# Patient Record
Sex: Female | Born: 1995 | Hispanic: No | Marital: Married | Smoking: Never smoker
Health system: Southern US, Community
[De-identification: ages and names within clinical notes are randomized; demographics above are authoritative.]

## PROBLEM LIST (undated history)

## (undated) DIAGNOSIS — Z8759 Personal history of other complications of pregnancy, childbirth and the puerperium: Secondary | ICD-10-CM

## (undated) DIAGNOSIS — Z789 Other specified health status: Secondary | ICD-10-CM

## (undated) DIAGNOSIS — K8020 Calculus of gallbladder without cholecystitis without obstruction: Secondary | ICD-10-CM

## (undated) DIAGNOSIS — O1490 Unspecified pre-eclampsia, unspecified trimester: Secondary | ICD-10-CM

## (undated) HISTORY — PX: NO PAST SURGERIES: SHX2092

## (undated) HISTORY — DX: Personal history of other complications of pregnancy, childbirth and the puerperium: Z87.59

---
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
## 2017-11-05 DIAGNOSIS — L709 Acne, unspecified: Secondary | ICD-10-CM | POA: Insufficient documentation

## 2020-08-18 ENCOUNTER — Other Ambulatory Visit: Payer: Self-pay | Admitting: Obstetrics and Gynecology

## 2020-08-18 ENCOUNTER — Other Ambulatory Visit (HOSPITAL_COMMUNITY): Payer: Self-pay | Admitting: Obstetrics and Gynecology

## 2020-08-18 DIAGNOSIS — O3680X1 Pregnancy with inconclusive fetal viability, fetus 1: Secondary | ICD-10-CM

## 2020-08-31 ENCOUNTER — Ambulatory Visit
Admission: RE | Admit: 2020-08-31 | Discharge: 2020-08-31 | Disposition: A | Discharge status: Home / Self Care | Payer: Self-pay | Source: Ambulatory Visit | Attending: Obstetrics and Gynecology | Admitting: Obstetrics and Gynecology

## 2020-08-31 ENCOUNTER — Other Ambulatory Visit: Payer: Self-pay

## 2020-08-31 DIAGNOSIS — O3680X1 Pregnancy with inconclusive fetal viability, fetus 1: Secondary | ICD-10-CM | POA: Insufficient documentation

## 2020-09-04 NOTE — L&D Delivery Note (Signed)
Delivery Note Patient received 3 doses of misoprostol overnight.  She then rapidly entered active labor and progressed to 10 cm without further augmentation.  She pushed well for just under 1 hour.  At 10:22 AM a viable female was delivered via Vaginal, Spontaneous (Presentation: Left Occiput Anterior).  APGAR: 9, 9; weight  2656 gm (5lb 13.7 oz).   Placenta status: Spontaneous, Intact.  Cord: 3 vessels with the following complications: None.  Cord pH: n/a  Anesthesia: Epidural Episiotomy: None Lacerations: Vaginal (left) Suture Repair: 2.0 vicryl rapide Est. Blood Loss (mL): 150  Mom to postpartum.  Baby to Couplet care / Skin to Skin.  Avaley Coop GEFFEL Annaleise Burger 03/18/2021, 11:28 AM

## 2020-09-07 LAB — OB RESULTS CONSOLE RPR: RPR: NONREACTIVE

## 2020-09-07 LAB — OB RESULTS CONSOLE ABO/RH: RH Type: POSITIVE

## 2020-09-07 LAB — OB RESULTS CONSOLE GC/CHLAMYDIA
Chlamydia: NEGATIVE
Gonorrhea: NEGATIVE

## 2020-09-07 LAB — OB RESULTS CONSOLE HIV ANTIBODY (ROUTINE TESTING): HIV: NONREACTIVE

## 2020-09-07 LAB — OB RESULTS CONSOLE RUBELLA ANTIBODY, IGM: Rubella: IMMUNE

## 2020-09-07 LAB — OB RESULTS CONSOLE HEPATITIS B SURFACE ANTIGEN: Hepatitis B Surface Ag: NEGATIVE

## 2020-09-23 ENCOUNTER — Other Ambulatory Visit: Payer: Self-pay

## 2020-10-04 ENCOUNTER — Other Ambulatory Visit: Payer: Self-pay

## 2020-10-08 ENCOUNTER — Other Ambulatory Visit: Payer: Self-pay | Admitting: Obstetrics and Gynecology

## 2020-10-08 DIAGNOSIS — Z1379 Encounter for other screening for genetic and chromosomal anomalies: Secondary | ICD-10-CM

## 2020-10-08 DIAGNOSIS — Z3A15 15 weeks gestation of pregnancy: Secondary | ICD-10-CM

## 2020-10-11 ENCOUNTER — Telehealth: Payer: Self-pay

## 2020-10-11 NOTE — Telephone Encounter (Signed)
Calculated good faith estimates for her appointments on 10/13/20 and 10/14/20. They have been printed out and will be given to her at check in.

## 2020-10-13 ENCOUNTER — Other Ambulatory Visit: Payer: Self-pay | Admitting: Obstetrics and Gynecology

## 2020-10-13 ENCOUNTER — Ambulatory Visit (HOSPITAL_BASED_OUTPATIENT_CLINIC_OR_DEPARTMENT_OTHER): Payer: BC Managed Care – PPO | Admitting: Genetic Counselor

## 2020-10-13 ENCOUNTER — Ambulatory Visit: Payer: Self-pay

## 2020-10-13 ENCOUNTER — Other Ambulatory Visit: Payer: Self-pay

## 2020-10-13 ENCOUNTER — Encounter: Payer: Self-pay | Admitting: *Deleted

## 2020-10-13 ENCOUNTER — Other Ambulatory Visit: Payer: Self-pay | Admitting: *Deleted

## 2020-10-13 ENCOUNTER — Ambulatory Visit (HOSPITAL_BASED_OUTPATIENT_CLINIC_OR_DEPARTMENT_OTHER): Payer: BC Managed Care – PPO

## 2020-10-13 ENCOUNTER — Ambulatory Visit: Payer: BC Managed Care – PPO | Attending: Obstetrics and Gynecology

## 2020-10-13 ENCOUNTER — Ambulatory Visit: Payer: Self-pay | Admitting: Genetic Counselor

## 2020-10-13 ENCOUNTER — Ambulatory Visit: Payer: BC Managed Care – PPO | Admitting: *Deleted

## 2020-10-13 DIAGNOSIS — O285 Abnormal chromosomal and genetic finding on antenatal screening of mother: Secondary | ICD-10-CM | POA: Insufficient documentation

## 2020-10-13 DIAGNOSIS — Z1379 Encounter for other screening for genetic and chromosomal anomalies: Secondary | ICD-10-CM

## 2020-10-13 DIAGNOSIS — O99212 Obesity complicating pregnancy, second trimester: Secondary | ICD-10-CM | POA: Insufficient documentation

## 2020-10-13 DIAGNOSIS — Z315 Encounter for genetic counseling: Secondary | ICD-10-CM

## 2020-10-13 DIAGNOSIS — Z3A14 14 weeks gestation of pregnancy: Secondary | ICD-10-CM | POA: Insufficient documentation

## 2020-10-13 DIAGNOSIS — Z3A15 15 weeks gestation of pregnancy: Secondary | ICD-10-CM

## 2020-10-13 NOTE — Progress Notes (Signed)
10/13/2020  Angela Lozano March 27, 1996 MRN: [id number] DOV: 10/13/2020  Angela Lozano presented to the [facility] 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 Angela Lozano was accompanied to her appointment by her husband, Angela Lozano.   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 female. Her current pregnancy has completed [redacted]w[redacted]d(Estimated Date of Delivery: 04/07/21).  Angela Lozano exposure to environmental toxins or chemical agents. She denied the use of alcohol, tobacco or street drugs. She reported taking prenatal vitamins and baby aspirin. She denied significant viral illnesses, fevers, and bleeding during the course of her pregnancy. Her medical and surgical histories were noncontributory.  Family History  A three generation pedigree was drafted and reviewed. Family history information was collected by [employer]. The family history is remarkable for the following:  - Ms. GKopischkehas a maternal aunt whose son has autism. Her husband's sister also has a daughter with autism. We reviewed that autism can be isolated, multifactorial, or part of a genetic syndrome; however, underlying genetic conditions account for less than 10% of autism diagnoses. Recurrence risk for first-degree relatives of individuals with idiopathic autism is estimated to be between 2-8%. If the relatives with autism in the family have isolated, idiopathic autism, the risk of recurrence in the couple's children would likely not be greatly elevated above the general population risk of ~1 in 550 or 1.5%. If there is a genetic explanation for autism in the family, recurrence risks could be higher. Without further information, risk  assessment is limited.  - Ms. GPetraliahusband had a distant cousin with Down syndrome. Down syndrome is most often caused by an entire extra copy of chromosome 21. This occurs due to errors in chromosomal division during the creation of egg and sperm cells in a process called nondisjunction. If the cousin in the family had full trisomy 232due to a nondisjunction event, there would not be an impact on Ms. GRealpersonal risk to have a pregnancy affected by Down syndrome, as this process occurs sporadically. However, every woman has an age-related risk for having a pregnancy affected by a chromosomal condition such as Down syndrome. Ms. GStephaninoninvasive prenatal screening performed during this pregnancy was low-risk for trisomy 21, significantly decreasing the chance of the current pregnancy being affected by Down syndrome. See Discussion section for more details.  The remaining family histories were reviewed and found to be noncontributory for birth defects, intellectual disability, recurrent pregnancy loss, and known genetic conditions.    The patient's ancestry is [place] The father of the pregnancy's ancestry is [place] Ashkenazi Jewish ancestry was denied. Distant consanguinity was reported, with the couple's great grandparents being an unknown degree of cousins to one another. Pedigree will be scanned under Media.  Discussion  NIPS result:   Ms. 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 The risks for trisomy 234 trisomy 114 trisomy 169 sex chromosome aneuploidies, and triploidy in the current pregnancy were significantly reduced.  We reviewed that 22q11.2 deletion syndrome, also called DiGeorge syndrome or velocardiofacial syndrome is caused by the deletion of genes on one end of chromosome 22 (the q11.2 region of the chromosome).  Common features of this condition include heart defects, cleft palate, characteristic facial  features, immune deficiency, and learning disabilities. Approximately 20% of individuals with 22q11.2 deletion syndrome are estimated to have autism spectrum disorders. Less commonly, individuals with 22q11.2 deletion syndrome may have an autoimmune disease, growth hormone deficiency, hearing loss, and/or psychiatric illness. Symptoms may vary significantly from person to person, even among relatives. Most individuals with 22q11.2 deletion syndrome live into adulthood.    Approximately 90% of individuals with 22q11.2 deletion syndrome have a de novo deletion of the 22q11.2 region that occurred for the first time in them. Approximately 10% of individuals inherit the deletion from one parent who also carries the deletion of the 22q11.2 region. Some individuals are not diagnosed with the condition until they have a child that is more severely affected than them. 22q11.2 deletion syndrome is an autosomal dominant condition, meaning once a person has the condition they have a 50% (1 in 2) chance to pass on the chromosome 22 that has the deletion with each pregnancy. However, parents and children with 22q11.2 deletion syndrome can have very different features from one another, and many symptoms cannot be predicted in the prenatal period.   We reviewed that NIPS analyzes cell free DNA originating from the placenta that is found in the maternal blood circulation during pregnancy. This screen can provide information regarding the presence or absence of extra fetal DNA for chromosomes 13, 18 and 21 as well as the sex chromosomes. NIPS can also be used to estimate the risk for a 22q11.2 microdeletion. The reported detection rate is 90% for 22q11.2 deletion syndrome. However, it cannot be considered diagnostic. Positive predictive value (PPV) is the probability that a pregnancy with a positive test result is truly affected. The PPV  reported by the NIPS laboratory for 22q11.2 deletion syndrome in the current fetus was estimated to be 50%. The PPV calculator offered by the [employer] estimated PPV for 22q11.2 deletion syndrome in the current fetus to be 2%. Thus, there is a 2-50% chance that this could be a true positive result and an 50-98% chance that this result is a false positive.   Ms. Hinger was counseled that there are several possible explanations for her high risk NIPS result. Firstly, the fetus could truly be affected by 22q11.2 deletion syndrome. Secondly, the fetus could be mosaic for 22q11.2 deletion syndrome. Mosaicism occurs when not every cell in the body is genetically identical; some cells in the body may have a genetic change involving chromosome 22, whereas others may have normal chromosomes or a different chromosomal makeup. A third possibility is that of confined placental mosaicism, or a result representative of the placenta only rather than the fetus. Finally, it is possible that this is a false positive result.    Testing options:   We reviewed several screening/testing options available to assess for 22q11.2 deletion syndrome in the current fetus. Firstly, Ms. Friddle may opt to continue following the pregnancy via ultrasound. Ultrasound findings that can be associated with 22q11.2 deletion syndrome include congenital heart defects, cleft lip/palate, renal anomalies, skeletal anomalies, and polyhydramnios. However, Ms. Shrode was counseled that not all fetuses with 22q11.2 deletion syndrome will demonstrate signs of the condition on ultrasound. Given that congenital heart defects are common findings of 22q11.2 deletion syndrome, it is recommended that Ms. Hartwell also have a fetal echocardiogram performed to assess for fetal cardiac defects. We will place this referral.   Ms. Deanda was also  counseled regarding the option of diagnostic testing via amniocentesis after 16  weeks' gestation. We discussed the technical aspects of each procedure and quoted up to a 1 in 500 (0.2%) risk for spontaneous pregnancy loss or other adverse pregnancy outcomes as a result of the procedure. Cultured cells from an amniotic fluid sample allow for the visualization of a fetal karyotype, which can detect >99% of large chromosomal aberrations. Chromosomal microarray can also be performed to identify smaller deletions or duplications of fetal chromosomal material, including a deletion of 22q11.2. However, we discussed that even if a fetus were confirmed to have 22q11.2 deletion syndrome on diagnostic testing, it is not possible to predict every symptom the child will have or how severely affected they will be during the prenatal period.    Lastly, Ms. Sousa was made aware that she has the option to wait and pursue testing postnatally. Regardless of when genetic testing is performed, if the fetus/baby is found to have 22q11.2 deletion syndrome, parental testing would be recommended to determine possible recurrence risks for future pregnancies.   Carrier screening:  Finally, per ACOG recommendation, carrier screening for hemoglobinopathies, cystic fibrosis (CF) and spinal muscular atrophy (SMA) was discussed including information about the conditions, rationale for testing, autosomal recessive inheritance, and the option of prenatal diagnosis. Ms. Lias previously had a normal hemoglobin electrophoresis, reducing her chance of being a carrier for a hemoglobinopathy such as sickle cell disease. I offered additional carrier screening for CF and SMA, which Ms. Mille declined at this time. She informed me that she will consider carrier screening and may elect to have testing performed at a future visit.  Without carrier screening to refine risk and based on the pan-ethnic carrier frequencies  for the conditions, Ms. Ethington chance of being a carrier for CF is 1 in 42 and her chance of being a carrier for SMA is 1 in 61. Ms. Stockburger was informed that select hemoglobinopathies, CF, and SMA are included on [place]'s newborn screen.   Plan:  Ms. Creedon informed me that she may consider undergoing diagnostic testing later in her pregnancy for preparation purposes. She will be continuing the pregnancy regardless of whether or not a diagnosis of 22q11.2 deletion syndrome were made prenatally. She had an ultrasound following her genetic counseling consultation today which demonstrated an echogenic intracardiac focus and possible two vessel cord (see Ultrasound report for further details). Views of fetal anatomy were limited due to early gestational age, so she will return for a detailed ultrasound in four weeks and we will place a referral for a fetal echocardiogram.   In the meantime, Ms. Laningham requested additional resources on 22q11.2 deletion syndrome and amniocentesis to review while considering whether or not she would like to pursue diagnostic testing. 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 Ms. Wynia may also consider pursuing carrier screening for CF and SMA at a future visit. I also provided her with a pamphlet on carrier screening.  I counseled Ms. Yamaguchi regarding the above risks and available options. The approximate face-to-face time with the genetic counselor was 60 minutes.  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

## 2020-10-14 ENCOUNTER — Other Ambulatory Visit: Payer: Self-pay

## 2020-10-14 ENCOUNTER — Other Ambulatory Visit: Payer: Self-pay | Admitting: *Deleted

## 2020-10-14 ENCOUNTER — Ambulatory Visit: Payer: BC Managed Care – PPO

## 2020-10-14 DIAGNOSIS — O285 Abnormal chromosomal and genetic finding on antenatal screening of mother: Secondary | ICD-10-CM

## 2020-11-10 ENCOUNTER — Other Ambulatory Visit: Payer: Self-pay | Admitting: *Deleted

## 2020-11-10 ENCOUNTER — Ambulatory Visit: Payer: BC Managed Care – PPO | Attending: Obstetrics

## 2020-11-10 ENCOUNTER — Encounter: Payer: Self-pay | Admitting: *Deleted

## 2020-11-10 ENCOUNTER — Ambulatory Visit: Payer: BC Managed Care – PPO | Admitting: *Deleted

## 2020-11-10 ENCOUNTER — Other Ambulatory Visit: Payer: Self-pay

## 2020-11-10 VITALS — BP 132/74 | HR 85

## 2020-11-10 DIAGNOSIS — O283 Abnormal ultrasonic finding on antenatal screening of mother: Secondary | ICD-10-CM

## 2020-11-10 DIAGNOSIS — O285 Abnormal chromosomal and genetic finding on antenatal screening of mother: Secondary | ICD-10-CM | POA: Diagnosis present

## 2020-11-10 DIAGNOSIS — O289 Unspecified abnormal findings on antenatal screening of mother: Secondary | ICD-10-CM | POA: Diagnosis not present

## 2020-11-10 DIAGNOSIS — E669 Obesity, unspecified: Secondary | ICD-10-CM | POA: Diagnosis not present

## 2020-11-10 DIAGNOSIS — Z3A18 18 weeks gestation of pregnancy: Secondary | ICD-10-CM | POA: Diagnosis not present

## 2020-11-10 DIAGNOSIS — O99212 Obesity complicating pregnancy, second trimester: Secondary | ICD-10-CM

## 2020-12-08 ENCOUNTER — Encounter: Payer: Self-pay | Admitting: *Deleted

## 2020-12-08 ENCOUNTER — Ambulatory Visit: Payer: BC Managed Care – PPO | Admitting: *Deleted

## 2020-12-08 ENCOUNTER — Other Ambulatory Visit: Payer: Self-pay

## 2020-12-08 ENCOUNTER — Ambulatory Visit: Payer: BC Managed Care – PPO | Attending: Obstetrics and Gynecology

## 2020-12-08 ENCOUNTER — Other Ambulatory Visit: Payer: Self-pay | Admitting: *Deleted

## 2020-12-08 VITALS — BP 135/81 | HR 86

## 2020-12-08 DIAGNOSIS — O99212 Obesity complicating pregnancy, second trimester: Secondary | ICD-10-CM

## 2020-12-08 DIAGNOSIS — E669 Obesity, unspecified: Secondary | ICD-10-CM | POA: Diagnosis not present

## 2020-12-08 DIAGNOSIS — O281 Abnormal biochemical finding on antenatal screening of mother: Secondary | ICD-10-CM

## 2020-12-08 DIAGNOSIS — Z362 Encounter for other antenatal screening follow-up: Secondary | ICD-10-CM

## 2020-12-08 DIAGNOSIS — O283 Abnormal ultrasonic finding on antenatal screening of mother: Secondary | ICD-10-CM | POA: Insufficient documentation

## 2020-12-08 DIAGNOSIS — Z3A22 22 weeks gestation of pregnancy: Secondary | ICD-10-CM

## 2020-12-24 ENCOUNTER — Telehealth: Payer: Self-pay

## 2020-12-24 NOTE — Telephone Encounter (Signed)
Per [facility] Patient had Fetal Echo at [facility] on 12/15/20 - Report scanned into patient's chart

## 2021-01-06 ENCOUNTER — Encounter: Payer: Self-pay | Admitting: *Deleted

## 2021-01-06 ENCOUNTER — Ambulatory Visit: Payer: BC Managed Care – PPO | Admitting: *Deleted

## 2021-01-06 ENCOUNTER — Other Ambulatory Visit: Payer: Self-pay

## 2021-01-06 ENCOUNTER — Ambulatory Visit: Payer: BC Managed Care – PPO | Attending: Obstetrics

## 2021-01-06 ENCOUNTER — Other Ambulatory Visit: Payer: Self-pay | Admitting: *Deleted

## 2021-01-06 VITALS — BP 126/71 | HR 82

## 2021-01-06 DIAGNOSIS — Z362 Encounter for other antenatal screening follow-up: Secondary | ICD-10-CM

## 2021-01-06 DIAGNOSIS — O351XX0 Maternal care for (suspected) chromosomal abnormality in fetus, not applicable or unspecified: Secondary | ICD-10-CM

## 2021-01-06 DIAGNOSIS — Z3A27 27 weeks gestation of pregnancy: Secondary | ICD-10-CM

## 2021-01-06 DIAGNOSIS — E669 Obesity, unspecified: Secondary | ICD-10-CM

## 2021-01-06 DIAGNOSIS — O321XX0 Maternal care for breech presentation, not applicable or unspecified: Secondary | ICD-10-CM

## 2021-01-06 DIAGNOSIS — O283 Abnormal ultrasonic finding on antenatal screening of mother: Secondary | ICD-10-CM

## 2021-01-06 DIAGNOSIS — O281 Abnormal biochemical finding on antenatal screening of mother: Secondary | ICD-10-CM | POA: Diagnosis not present

## 2021-01-06 DIAGNOSIS — O99212 Obesity complicating pregnancy, second trimester: Secondary | ICD-10-CM

## 2021-01-06 DIAGNOSIS — O3519X0 Maternal care for (suspected) chromosomal abnormality in fetus, other chromosomal abnormality, not applicable or unspecified: Secondary | ICD-10-CM

## 2021-02-17 ENCOUNTER — Other Ambulatory Visit: Payer: Self-pay

## 2021-02-17 ENCOUNTER — Ambulatory Visit: Payer: BC Managed Care – PPO | Attending: Obstetrics and Gynecology

## 2021-02-17 ENCOUNTER — Encounter: Payer: Self-pay | Admitting: *Deleted

## 2021-02-17 ENCOUNTER — Ambulatory Visit: Payer: BC Managed Care – PPO | Admitting: *Deleted

## 2021-02-17 VITALS — BP 132/75 | HR 76

## 2021-02-17 DIAGNOSIS — O351XX0 Maternal care for (suspected) chromosomal abnormality in fetus, not applicable or unspecified: Secondary | ICD-10-CM | POA: Insufficient documentation

## 2021-02-17 DIAGNOSIS — O3519X0 Maternal care for (suspected) chromosomal abnormality in fetus, other chromosomal abnormality, not applicable or unspecified: Secondary | ICD-10-CM

## 2021-02-17 DIAGNOSIS — O99213 Obesity complicating pregnancy, third trimester: Secondary | ICD-10-CM

## 2021-02-17 DIAGNOSIS — E669 Obesity, unspecified: Secondary | ICD-10-CM | POA: Diagnosis not present

## 2021-02-17 DIAGNOSIS — Z3A33 33 weeks gestation of pregnancy: Secondary | ICD-10-CM

## 2021-02-17 DIAGNOSIS — Z362 Encounter for other antenatal screening follow-up: Secondary | ICD-10-CM | POA: Diagnosis not present

## 2021-02-17 DIAGNOSIS — Z6835 Body mass index (BMI) 35.0-35.9, adult: Secondary | ICD-10-CM

## 2021-02-17 DIAGNOSIS — O289 Unspecified abnormal findings on antenatal screening of mother: Secondary | ICD-10-CM | POA: Diagnosis not present

## 2021-02-19 ENCOUNTER — Other Ambulatory Visit: Payer: Self-pay

## 2021-02-19 ENCOUNTER — Encounter (HOSPITAL_COMMUNITY): Payer: Self-pay | Admitting: Obstetrics and Gynecology

## 2021-02-19 ENCOUNTER — Inpatient Hospital Stay (HOSPITAL_COMMUNITY)
Admission: AD | Admit: 2021-02-19 | Discharge: 2021-02-20 | Disposition: A | Discharge status: Home / Self Care | Payer: BC Managed Care – PPO | Attending: Obstetrics and Gynecology | Admitting: Obstetrics and Gynecology

## 2021-02-19 DIAGNOSIS — O26893 Other specified pregnancy related conditions, third trimester: Secondary | ICD-10-CM | POA: Insufficient documentation

## 2021-02-19 DIAGNOSIS — M549 Dorsalgia, unspecified: Secondary | ICD-10-CM | POA: Insufficient documentation

## 2021-02-19 DIAGNOSIS — O99891 Other specified diseases and conditions complicating pregnancy: Secondary | ICD-10-CM | POA: Insufficient documentation

## 2021-02-19 DIAGNOSIS — O26613 Liver and biliary tract disorders in pregnancy, third trimester: Secondary | ICD-10-CM | POA: Insufficient documentation

## 2021-02-19 DIAGNOSIS — Z3A33 33 weeks gestation of pregnancy: Secondary | ICD-10-CM | POA: Insufficient documentation

## 2021-02-19 DIAGNOSIS — K8020 Calculus of gallbladder without cholecystitis without obstruction: Secondary | ICD-10-CM | POA: Insufficient documentation

## 2021-02-19 DIAGNOSIS — Z7982 Long term (current) use of aspirin: Secondary | ICD-10-CM | POA: Insufficient documentation

## 2021-02-19 DIAGNOSIS — O99613 Diseases of the digestive system complicating pregnancy, third trimester: Secondary | ICD-10-CM | POA: Insufficient documentation

## 2021-02-19 NOTE — MAU Note (Signed)
Back pain mid to lower back from around 8:20-10:20 pm. She also states she took her blood pressure at home and it was in the 170's. Denies visual changes, reports a headache 3/10 that began after the pain stopped. Denies vaginal bleeding, contractions, or leaking of fluid. +FM.

## 2021-02-20 ENCOUNTER — Inpatient Hospital Stay (HOSPITAL_COMMUNITY): Payer: BC Managed Care – PPO

## 2021-02-20 DIAGNOSIS — Z3A33 33 weeks gestation of pregnancy: Secondary | ICD-10-CM | POA: Diagnosis not present

## 2021-02-20 DIAGNOSIS — K8020 Calculus of gallbladder without cholecystitis without obstruction: Secondary | ICD-10-CM

## 2021-02-20 DIAGNOSIS — O99891 Other specified diseases and conditions complicating pregnancy: Secondary | ICD-10-CM

## 2021-02-20 DIAGNOSIS — O26613 Liver and biliary tract disorders in pregnancy, third trimester: Secondary | ICD-10-CM | POA: Diagnosis not present

## 2021-02-20 DIAGNOSIS — Z7982 Long term (current) use of aspirin: Secondary | ICD-10-CM | POA: Diagnosis not present

## 2021-02-20 DIAGNOSIS — O26893 Other specified pregnancy related conditions, third trimester: Secondary | ICD-10-CM | POA: Diagnosis not present

## 2021-02-20 DIAGNOSIS — O99613 Diseases of the digestive system complicating pregnancy, third trimester: Secondary | ICD-10-CM | POA: Diagnosis not present

## 2021-02-20 DIAGNOSIS — M5489 Other dorsalgia: Secondary | ICD-10-CM

## 2021-02-20 DIAGNOSIS — M549 Dorsalgia, unspecified: Secondary | ICD-10-CM | POA: Diagnosis not present

## 2021-02-20 LAB — COMPREHENSIVE METABOLIC PANEL
ALT: 15 U/L (ref 0–44)
AST: 17 U/L (ref 15–41)
Albumin: 2.9 g/dL — ABNORMAL LOW (ref 3.5–5.0)
Alkaline Phosphatase: 115 U/L (ref 38–126)
Anion gap: 6 (ref 5–15)
BUN: 7 mg/dL (ref 6–20)
CO2: 23 mmol/L (ref 22–32)
Calcium: 8.9 mg/dL (ref 8.9–10.3)
Chloride: 106 mmol/L (ref 98–111)
Creatinine, Ser: 0.51 mg/dL (ref 0.44–1.00)
GFR, Estimated: >60 mL/min (ref >60)
Glucose, Bld: 107 mg/dL — ABNORMAL HIGH (ref 70–99)
Potassium: 4.4 mmol/L (ref 3.5–5.1)
Sodium: 135 mmol/L (ref 135–145)
Total Bilirubin: 0.5 mg/dL (ref 0.3–1.2)
Total Protein: 5.9 g/dL — ABNORMAL LOW (ref 6.5–8.1)

## 2021-02-20 LAB — CBC WITH DIFFERENTIAL/PLATELET
Abs Immature Granulocytes: 0.03 10*3/uL (ref 0.00–0.07)
Basophils Absolute: 0 10*3/uL (ref 0.0–0.1)
Basophils Relative: 0 %
Eosinophils Absolute: 0.1 10*3/uL (ref 0.0–0.5)
Eosinophils Relative: 1 %
HCT: 36.3 % (ref 36.0–46.0)
Hemoglobin: 12 g/dL (ref 12.0–15.0)
Immature Granulocytes: 0 %
Lymphocytes Relative: 30 %
Lymphs Abs: 2.4 10*3/uL (ref 0.7–4.0)
MCH: 27.8 pg (ref 26.0–34.0)
MCHC: 33.1 g/dL (ref 30.0–36.0)
MCV: 84.2 fL (ref 80.0–100.0)
Monocytes Absolute: 0.5 10*3/uL (ref 0.1–1.0)
Monocytes Relative: 6 %
Neutro Abs: 5.2 10*3/uL (ref 1.7–7.7)
Neutrophils Relative %: 63 %
Platelets: 267 10*3/uL (ref 150–400)
RBC: 4.31 MIL/uL (ref 3.87–5.11)
RDW: 12.9 % (ref 11.5–15.5)
WBC: 8.1 10*3/uL (ref 4.0–10.5)
nRBC: 0 % (ref 0.0–0.2)

## 2021-02-20 LAB — URINALYSIS, ROUTINE W REFLEX MICROSCOPIC
Bilirubin Urine: NEGATIVE
Glucose, UA: NEGATIVE mg/dL
Hgb urine dipstick: NEGATIVE
Ketones, ur: NEGATIVE mg/dL
Nitrite: NEGATIVE
Protein, ur: NEGATIVE mg/dL
Specific Gravity, Urine: 1.001 — ABNORMAL LOW (ref 1.005–1.030)
pH: 7 (ref 5.0–8.0)

## 2021-02-20 NOTE — MAU Provider Note (Signed)
History     CSN: [id number]  Arrival date and time: 02/19/21 2245   Event Date/Time   First Provider Initiated Contact with Patient 02/20/21 0007      Chief Complaint  Patient presents with   Back Pain   HPI  Ms.Angela Lozano is a 25 y.o. female G1P0 @ [redacted]w[redacted]d  here with acute back pain that started today at 8:30 pm and stopped at 10 pm.  The pain Started 20 minuets after she ate dinner. She ate bo jangles. She ate fried chicken and fries. She had no nausea or vomiting. The pain stopped at 10 pm. She has 0/10 pain at this time. She did not take anything for the pain.   OB History     Gravida  1   Para      Term      Preterm      AB      Living         SAB      IAB      Ectopic      Multiple      Live Births              History reviewed. No pertinent past medical history.  No past surgical history on file.  Family History  Problem Relation Age of Onset   Miscarriages / [place] Mother     Social History   Tobacco Use   Smoking status: Never   Smokeless tobacco: Never  Vaping Use   Vaping Use: Never used  Substance Use Topics   Alcohol use: Not Currently   Drug use: Never    Allergies: No Known Allergies  Medications Prior to Admission  Medication Sig Dispense Refill Last Dose   aspirin EC 81 MG tablet Take 81 mg by mouth daily. Swallow whole.   02/19/2021   Prenatal Vit-Fe Fumarate-FA (PRENATAL MULTIVITAMIN) TABS tablet Take 1 tablet by mouth daily at 12 noon.   02/19/2021   Results for orders placed or performed during the hospital encounter of 02/19/21 (from the past 48 hour(s))  Urinalysis, Routine w reflex microscopic Urine, Clean Catch     Status: Abnormal   Collection Time: 02/19/21 11:07 PM  Result Value Ref Range   Color, Urine STRAW (A) YELLOW   APPearance CLEAR CLEAR   Specific Gravity, Urine 1.001 (L) 1.005 - 1.030   pH 7.0 5.0 - 8.0   Glucose, UA NEGATIVE NEGATIVE mg/dL   Hgb urine dipstick NEGATIVE NEGATIVE    Bilirubin Urine NEGATIVE NEGATIVE   Ketones, ur NEGATIVE NEGATIVE mg/dL   Protein, ur NEGATIVE NEGATIVE mg/dL   Nitrite NEGATIVE NEGATIVE   Leukocytes,Ua TRACE (A) NEGATIVE   RBC / HPF 0-5 0 - 5 RBC/hpf   WBC, UA 0-5 0 - 5 WBC/hpf   Bacteria, UA RARE (A) NONE SEEN   Squamous Epithelial / LPF 0-5 0 - 5    Comment: Performed at [facility], [address], [place], [place] [address]  CBC with Differential/Platelet     Status: None   Collection Time: 02/20/21  1:01 AM  Result Value Ref Range   WBC 8.1 4.0 - 10.5 K/uL   RBC 4.31 3.87 - 5.11 MIL/uL   Hemoglobin 12.0 12.0 - 15.0 g/dL   HCT 95.0 93.2 - 67.1 %   MCV 84.2 80.0 - 100.0 fL   MCH 27.8 26.0 - 34.0 pg   MCHC 33.1 30.0 - 36.0 g/dL   RDW 24.5 80.9 - 98.3 %  Platelets 267 150 - 400 K/uL   nRBC 0.0 0.0 - 0.2 %   Neutrophils Relative % 63 %   Neutro Abs 5.2 1.7 - 7.7 K/uL   Lymphocytes Relative 30 %   Lymphs Abs 2.4 0.7 - 4.0 K/uL   Monocytes Relative 6 %   Monocytes Absolute 0.5 0.1 - 1.0 K/uL   Eosinophils Relative 1 %   Eosinophils Absolute 0.1 0.0 - 0.5 K/uL   Basophils Relative 0 %   Basophils Absolute 0.0 0.0 - 0.1 K/uL   Immature Granulocytes 0 %   Abs Immature Granulocytes 0.03 0.00 - 0.07 K/uL    Comment: Performed at [facility], [address], [place], [place] [address]    US Abdomen Limited RUQ (LIVER/GB)  Result Date: 02/20/2021 CLINICAL DATA:  Back pain.  Thirty-three weeks pregnant. EXAM: ULTRASOUND ABDOMEN LIMITED RIGHT UPPER QUADRANT COMPARISON:  None. FINDINGS: Gallbladder: Cholelithiasis with multiple stones in the gallbladder measuring less than 1 cm. No wall thickening or edema. Murphy's sign is negative. Common bile duct: Diameter: 2 mm, normal Liver: No focal lesion identified. Within normal limits in parenchymal echogenicity. Portal vein is patent on color Doppler imaging with normal direction of blood flow towards the liver. Other: None. IMPRESSION: Cholelithiasis.  No additional  changes to suggest cholecystitis. Electronically Signed   By: Burman Nieves M.D.   On: 02/20/2021 01:01     Review of Systems  Gastrointestinal:  Negative for abdominal pain, nausea and vomiting.  Musculoskeletal:  Positive for back pain.  Physical Exam   Blood pressure 138/84, pulse 99, temperature 98.9 F (37.2 C), temperature source Oral, resp. rate 20, height 5\' 1"  (1.549 m), weight 95.5 kg, last menstrual period 06/17/2020, SpO2 100 %.  Patient Vitals for the past 24 hrs:  BP Temp Temp src Pulse Resp SpO2 Height Weight  02/20/21 0055 133/81 -- -- 86 -- -- -- --  02/19/21 2321 138/84 -- -- 99 -- -- -- --  02/19/21 2303 138/88 98.9 F (37.2 C) Oral 98 20 100 % 5\' 1"  (1.549 m) 95.5 kg      Physical Exam Vitals and nursing note reviewed.  Constitutional:      Appearance: Normal appearance. She is not ill-appearing, toxic-appearing or diaphoretic.  Pulmonary:     Effort: Pulmonary effort is normal.  Abdominal:     Tenderness: There is no abdominal tenderness. There is no right CVA tenderness or left CVA tenderness. Negative signs include Murphy's sign.  Skin:    General: Skin is warm.  Neurological:     Mental Status: She is alert and oriented to person, place, and time.  Psychiatric:        Behavior: Behavior normal.    MAU Course  Procedures None  MDM  RUQ 2304 ordered CBC & CMP ordered   Assessment and Plan   A:  1. Cholelithiasis affecting pregnancy in third trimester, antepartum   2. Back pain in pregnancy   3. [redacted] weeks gestation of pregnancy   4. Pain, upper back      P:  Discharge home in stable condition No signs of cholestasis  Strict return precautions Avoid fried foods and fatty foods  Deette Revak, , NP 02/21/2021 1:46 PM

## 2021-03-10 LAB — OB RESULTS CONSOLE GBS: GBS: NEGATIVE

## 2021-03-17 ENCOUNTER — Encounter (HOSPITAL_COMMUNITY): Payer: Self-pay | Admitting: Obstetrics and Gynecology

## 2021-03-17 ENCOUNTER — Inpatient Hospital Stay (HOSPITAL_COMMUNITY)
Admission: AD | Admit: 2021-03-17 | Discharge: 2021-03-19 | DRG: 807 | Disposition: A | Discharge status: Home / Self Care | Payer: BC Managed Care – PPO | Attending: Obstetrics | Admitting: Obstetrics

## 2021-03-17 ENCOUNTER — Other Ambulatory Visit: Payer: Self-pay

## 2021-03-17 DIAGNOSIS — Z3A37 37 weeks gestation of pregnancy: Secondary | ICD-10-CM | POA: Diagnosis not present

## 2021-03-17 DIAGNOSIS — Z20822 Contact with and (suspected) exposure to covid-19: Secondary | ICD-10-CM | POA: Diagnosis present

## 2021-03-17 DIAGNOSIS — O134 Gestational [pregnancy-induced] hypertension without significant proteinuria, complicating childbirth: Principal | ICD-10-CM | POA: Diagnosis present

## 2021-03-17 HISTORY — DX: Other specified health status: Z78.9

## 2021-03-17 HISTORY — DX: Calculus of gallbladder without cholecystitis without obstruction: K80.20

## 2021-03-17 LAB — CBC
HCT: 38.2 % (ref 36.0–46.0)
Hemoglobin: 12.2 g/dL (ref 12.0–15.0)
MCH: 26.9 pg (ref 26.0–34.0)
MCHC: 31.9 g/dL (ref 30.0–36.0)
MCV: 84.3 fL (ref 80.0–100.0)
Platelets: 251 10*3/uL (ref 150–400)
RBC: 4.53 MIL/uL (ref 3.87–5.11)
RDW: 13.4 % (ref 11.5–15.5)
WBC: 6.2 10*3/uL (ref 4.0–10.5)
nRBC: 0 % (ref 0.0–0.2)

## 2021-03-17 LAB — TYPE AND SCREEN
ABO/RH(D): A POS
Antibody Screen: NEGATIVE

## 2021-03-17 LAB — RESP PANEL BY RT-PCR (FLU A&B, COVID) ARPGX2
Influenza A by PCR: NEGATIVE
Influenza B by PCR: NEGATIVE
SARS Coronavirus 2 by RT PCR: NEGATIVE

## 2021-03-17 MED ORDER — FLEET ENEMA 7-19 GM/118ML RE ENEM: 1.0000 | ENEMA | RECTAL | Status: DC | PRN

## 2021-03-17 MED ORDER — PHENYLEPHRINE 40 MCG/ML (10ML) SYRINGE FOR IV PUSH (FOR BLOOD PRESSURE SUPPORT)
80.0000 ug | PREFILLED_SYRINGE | INTRAVENOUS | Status: DC | PRN
  Filled 2021-03-17: qty 10

## 2021-03-17 MED ORDER — FENTANYL-BUPIVACAINE-NACL 0.5-0.125-0.9 MG/250ML-% EP SOLN
12.0000 mL/h | EPIDURAL | Status: DC | PRN
  Administered 2021-03-18: 12 mL/h via EPIDURAL
  Filled 2021-03-17: qty 250

## 2021-03-17 MED ORDER — EPHEDRINE 5 MG/ML INJ: 10.0000 mg | INTRAVENOUS | Status: DC | PRN

## 2021-03-17 MED ORDER — DIPHENHYDRAMINE HCL 50 MG/ML IJ SOLN: 12.5000 mg | INTRAMUSCULAR | Status: DC | PRN

## 2021-03-17 MED ORDER — LACTATED RINGERS IV SOLN: 500.0000 mL | INTRAVENOUS | Status: DC | PRN

## 2021-03-17 MED ORDER — ONDANSETRON HCL 4 MG/2ML IJ SOLN: 4.0000 mg | Freq: Four times a day (QID) | INTRAMUSCULAR | Status: DC | PRN

## 2021-03-17 MED ORDER — SOD CITRATE-CITRIC ACID 500-334 MG/5ML PO SOLN: 30.0000 mL | ORAL | Status: DC | PRN

## 2021-03-17 MED ORDER — MISOPROSTOL 25 MCG QUARTER TABLET
25.0000 ug | ORAL_TABLET | ORAL | Status: DC | PRN
  Administered 2021-03-17 – 2021-03-18 (×3): 25 ug via VAGINAL
  Filled 2021-03-17 (×3): qty 1

## 2021-03-17 MED ORDER — OXYCODONE-ACETAMINOPHEN 5-325 MG PO TABS
1.0000 | ORAL_TABLET | ORAL | Status: DC | PRN
Start: 2021-03-17 — End: 2021-03-18

## 2021-03-17 MED ORDER — TERBUTALINE SULFATE 1 MG/ML IJ SOLN: 0.2500 mg | Freq: Once | INTRAMUSCULAR | Status: DC | PRN

## 2021-03-17 MED ORDER — LACTATED RINGERS IV SOLN
500.0000 mL | Freq: Once | INTRAVENOUS | Status: AC
  Administered 2021-03-18: 500 mL via INTRAVENOUS

## 2021-03-17 MED ORDER — LACTATED RINGERS IV SOLN: INTRAVENOUS | Status: DC

## 2021-03-17 MED ORDER — BUTORPHANOL TARTRATE 1 MG/ML IJ SOLN
1.0000 mg | INTRAMUSCULAR | Status: DC | PRN
  Administered 2021-03-18: 1 mg via INTRAVENOUS
  Filled 2021-03-17 (×2): qty 1

## 2021-03-17 MED ORDER — PHENYLEPHRINE 40 MCG/ML (10ML) SYRINGE FOR IV PUSH (FOR BLOOD PRESSURE SUPPORT): 80.0000 ug | PREFILLED_SYRINGE | INTRAVENOUS | Status: DC | PRN

## 2021-03-17 MED ORDER — LIDOCAINE HCL (PF) 1 % IJ SOLN: 30.0000 mL | INTRAMUSCULAR | Status: DC | PRN

## 2021-03-17 MED ORDER — ACETAMINOPHEN 325 MG PO TABS: 650.0000 mg | ORAL_TABLET | ORAL | Status: DC | PRN

## 2021-03-17 MED ORDER — OXYTOCIN-SODIUM CHLORIDE 30-0.9 UT/500ML-% IV SOLN
2.5000 [IU]/h | INTRAVENOUS | Status: DC
  Filled 2021-03-17: qty 500

## 2021-03-17 MED ORDER — OXYTOCIN BOLUS FROM INFUSION: 333.0000 mL | Freq: Once | INTRAVENOUS | Status: DC

## 2021-03-17 MED ORDER — OXYCODONE-ACETAMINOPHEN 5-325 MG PO TABS
2.0000 | ORAL_TABLET | ORAL | Status: DC | PRN
Start: 2021-03-17 — End: 2021-03-18

## 2021-03-17 NOTE — MAU Note (Signed)
Urine in lab 

## 2021-03-17 NOTE — H&P (Signed)
25 y.o. [redacted]w[redacted]d  G1P0 comes in for [facility] noted in office today.  No severe signs or symptoms.  Otherwise has good fetal movement and no bleeding.  Past Medical History:  Diagnosis Date   Gallstone    Medical history non-contributory     Past Surgical History:  Procedure Laterality Date   NO PAST SURGERIES      OB History  Gravida Para Term Preterm AB Living  1            SAB IAB Ectopic Multiple Live Births          0    # Outcome Date GA Lbr Len/2nd Weight Sex Delivery Anes PTL Lv  1 Current             Social History   Socioeconomic History   Marital status: Single    Spouse name: Not on file   Number of children: Not on file   Years of education: Not on file   Highest education level: Not on file  Occupational History   Not on file  Tobacco Use   Smoking status: Never   Smokeless tobacco: Never  Vaping Use   Vaping Use: Never used  Substance and Sexual Activity   Alcohol use: Not Currently   Drug use: Never   Sexual activity: Not on file  Other Topics Concern   Not on file  Social History Narrative   Not on file   Social Determinants of Health   Financial Resource Strain: Not on file  Food Insecurity: Not on file  Transportation Needs: Not on file  Physical Activity: Not on file  Stress: Not on file  Social Connections: Not on file  Intimate Partner Violence: Not on file   Patient has no known allergies.    Prenatal Transfer Tool  Maternal Diabetes: No  Genetic Screening: Abnormal:  Results: Other:  MFM note reviewed: referred for chromosone 22q11.2 deletion. 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 EIF and echogenic bowel seen on 19 week [place].  Maternal Ultrasounds/Referrals: Isolated EIF (echogenic intracardiac focus) and Echogenic bowel Fetal Ultrasounds or other Referrals:  Fetal echo - normal Maternal Substance Abuse:  No Significant Maternal Medications:  None Significant Maternal Lab Results:  Group B Strep negative  Other PNC: uncomplicated.    Vitals:   03/17/21 1600 03/17/21 1615 03/17/21 1720 03/17/21 1733  BP: (!) 141/80 138/89 128/73 139/90  Pulse: 97 100 84 86  Resp:      Temp:    99.2 F (37.3 C)  TempSrc:    Oral  SpO2: 98% 98% 97%   Weight:      Height:        Lungs/Cor:  NAD Abdomen:  soft, gravid Ex:  no cords, erythema SVE:  per nurse, 2/th/-2 FHTs:  120s, good STV, NST R; Cat 1 tracing. Toco:  q occ  Results for orders placed or performed during the hospital encounter of 03/17/21 (from the past 24 hour(s))  CBC     Status: None   Collection Time: 03/17/21  4:10 PM  Result Value Ref Range   WBC 6.2 4.0 - 10.5 K/uL   RBC 4.53 3.87 - 5.11 MIL/uL   Hemoglobin 12.2 12.0 - 15.0 g/dL   HCT 40.9 81.1 - 91.4 %   MCV 84.3 80.0 - 100.0 fL   MCH 26.9 26.0 - 34.0 pg   MCHC 31.9 30.0 - 36.0 g/dL   RDW 78.2 95.6 - 21.3 %  Platelets 251 150 - 400 K/uL   nRBC 0.0 0.0 - 0.2 %  Type and screen [facility]     Status: None   Collection Time: 03/17/21  4:18 PM  Result Value Ref Range   ABO/RH(D) A POS    Antibody Screen NEG    Sample Expiration      03/20/2021,2359 Performed at [facility], [address] [place], [place] [address]      A/P   TErm GHTN, no severe.  For induction with cytotec.    GBS neg.  CMET in am.  Loney Laurence

## 2021-03-17 NOTE — MAU Note (Signed)
Pt presents to MAU for admission to L/D for induction for high blood pressure. Reports good fetal movement

## 2021-03-18 ENCOUNTER — Inpatient Hospital Stay (HOSPITAL_COMMUNITY): Payer: BC Managed Care – PPO | Admitting: Anesthesiology

## 2021-03-18 ENCOUNTER — Encounter (HOSPITAL_COMMUNITY): Payer: Self-pay | Admitting: Obstetrics and Gynecology

## 2021-03-18 LAB — COMPREHENSIVE METABOLIC PANEL
ALT: 16 U/L (ref 0–44)
AST: 18 U/L (ref 15–41)
Albumin: 2.9 g/dL — ABNORMAL LOW (ref 3.5–5.0)
Alkaline Phosphatase: 165 U/L — ABNORMAL HIGH (ref 38–126)
Anion gap: 8 (ref 5–15)
BUN: <5 mg/dL — ABNORMAL LOW (ref 6–20)
CO2: 22 mmol/L (ref 22–32)
Calcium: 8.9 mg/dL (ref 8.9–10.3)
Chloride: 105 mmol/L (ref 98–111)
Creatinine, Ser: 0.49 mg/dL (ref 0.44–1.00)
GFR, Estimated: >60 mL/min (ref >60)
Glucose, Bld: 82 mg/dL (ref 70–99)
Potassium: 3.7 mmol/L (ref 3.5–5.1)
Sodium: 135 mmol/L (ref 135–145)
Total Bilirubin: 0.6 mg/dL (ref 0.3–1.2)
Total Protein: 6 g/dL — ABNORMAL LOW (ref 6.5–8.1)

## 2021-03-18 LAB — RPR: RPR Ser Ql: NONREACTIVE

## 2021-03-18 MED ORDER — COCONUT OIL OIL: 1.0000 "application " | TOPICAL_OIL | Status: DC | PRN

## 2021-03-18 MED ORDER — LACTATED RINGERS IV SOLN: 500.0000 mL | Freq: Once | INTRAVENOUS | Status: DC

## 2021-03-18 MED ORDER — ONDANSETRON HCL 4 MG/2ML IJ SOLN: 4.0000 mg | INTRAMUSCULAR | Status: DC | PRN

## 2021-03-18 MED ORDER — DIBUCAINE (PERIANAL) 1 % EX OINT
1.0000 | TOPICAL_OINTMENT | CUTANEOUS | Status: DC | PRN
Start: 2021-03-18 — End: 2021-03-19

## 2021-03-18 MED ORDER — BENZOCAINE-MENTHOL 20-0.5 % EX AERO
1.0000 | INHALATION_SPRAY | CUTANEOUS | Status: DC | PRN
Start: 2021-03-18 — End: 2021-03-19

## 2021-03-18 MED ORDER — SIMETHICONE 80 MG PO CHEW: 80.0000 mg | CHEWABLE_TABLET | ORAL | Status: DC | PRN

## 2021-03-18 MED ORDER — ONDANSETRON HCL 4 MG PO TABS: 4.0000 mg | ORAL_TABLET | ORAL | Status: DC | PRN

## 2021-03-18 MED ORDER — IBUPROFEN 600 MG PO TABS
600.0000 mg | ORAL_TABLET | Freq: Four times a day (QID) | ORAL | Status: DC
  Administered 2021-03-18 – 2021-03-19 (×5): 600 mg via ORAL
  Filled 2021-03-18 (×5): qty 1

## 2021-03-18 MED ORDER — TETANUS-DIPHTH-ACELL PERTUSSIS 5-2.5-18.5 LF-MCG/0.5 IM SUSY: 0.5000 mL | PREFILLED_SYRINGE | Freq: Once | INTRAMUSCULAR | Status: DC

## 2021-03-18 MED ORDER — OXYCODONE HCL 5 MG PO TABS: 10.0000 mg | ORAL_TABLET | ORAL | Status: DC | PRN

## 2021-03-18 MED ORDER — PRENATAL MULTIVITAMIN CH
1.0000 | ORAL_TABLET | Freq: Every day | ORAL | Status: DC
  Administered 2021-03-19: 1 via ORAL
  Filled 2021-03-18: qty 1

## 2021-03-18 MED ORDER — WITCH HAZEL-GLYCERIN EX PADS: 1.0000 "application " | MEDICATED_PAD | CUTANEOUS | Status: DC | PRN

## 2021-03-18 MED ORDER — LIDOCAINE HCL (PF) 1 % IJ SOLN
INTRAMUSCULAR | Status: DC | PRN
  Administered 2021-03-18: 3 mL via EPIDURAL
  Administered 2021-03-18: 5 mL via EPIDURAL

## 2021-03-18 MED ORDER — ACETAMINOPHEN 325 MG PO TABS: 650.0000 mg | ORAL_TABLET | ORAL | Status: DC | PRN

## 2021-03-18 MED ORDER — SENNOSIDES-DOCUSATE SODIUM 8.6-50 MG PO TABS
2.0000 | ORAL_TABLET | ORAL | Status: DC
  Administered 2021-03-18 – 2021-03-19 (×2): 2 via ORAL
  Filled 2021-03-18 (×2): qty 2

## 2021-03-18 MED ORDER — DIPHENHYDRAMINE HCL 25 MG PO CAPS: 25.0000 mg | ORAL_CAPSULE | Freq: Four times a day (QID) | ORAL | Status: DC | PRN

## 2021-03-18 MED ORDER — OXYCODONE HCL 5 MG PO TABS: 5.0000 mg | ORAL_TABLET | ORAL | Status: DC | PRN

## 2021-03-18 NOTE — Anesthesia Preprocedure Evaluation (Signed)
Anesthesia Evaluation  Patient identified by MRN, date of birth, ID band Patient awake    Reviewed: Allergy & Precautions, NPO status , Patient's Chart, lab work & pertinent test results  Airway Mallampati: II  TM Distance: >3 FB Neck ROM: Full    Dental  (+) Teeth Intact, Dental Advisory Given   Pulmonary neg pulmonary ROS,    Pulmonary exam normal breath sounds clear to auscultation       Cardiovascular hypertension (GHTN), Normal cardiovascular exam Rhythm:Regular Rate:Normal     Neuro/Psych negative neurological ROS     GI/Hepatic negative GI ROS, Neg liver ROS,   Endo/Other  negative endocrine ROSObesity   Renal/GU negative Renal ROS     Musculoskeletal negative musculoskeletal ROS (+)   Abdominal   Peds  Hematology negative hematology ROS (+) Plt 251k    Anesthesia Other Findings Day of surgery medications reviewed with the patient.  Reproductive/Obstetrics                             Anesthesia Physical Anesthesia Plan  ASA: 3  Anesthesia Plan: Epidural   Post-op Pain Management:    Induction:   PONV Risk Score and Plan: 2 and Treatment may vary due to age or medical condition  Airway Management Planned: Natural Airway  Additional Equipment:   Intra-op Plan:   Post-operative Plan:   Informed Consent: I have reviewed the patients History and Physical, chart, labs and discussed the procedure including the risks, benefits and alternatives for the proposed anesthesia with the patient or authorized representative who has indicated his/her understanding and acceptance.     Dental advisory given  Plan Discussed with:   Anesthesia Plan Comments: (Patient identified. Risks/Benefits/Options discussed with patient including but not limited to bleeding, infection, nerve damage, paralysis, failed block, incomplete pain control, headache, blood pressure changes, nausea,  vomiting, reactions to medication both or allergic, itching and postpartum back pain. Confirmed with bedside nurse the patient's most recent platelet count. Confirmed with patient that they are not currently taking any anticoagulation, have any bleeding history or any family history of bleeding disorders. Patient expressed understanding and wished to proceed. All questions were answered. )        Anesthesia Quick Evaluation

## 2021-03-18 NOTE — Lactation Note (Signed)
This note was copied from a baby's chart. Lactation Consultation Note  Patient Name: Angela Lozano Date: 03/18/2021 Reason for consult: Initial assessment;Mother's request;Primapara;1st time breastfeeding;Early term 37-38.6wks;Infant < 6lbs Age:24 hours  Mother stated infant fed 30 minutes both breast 1 hr prior to [facility] visit. Mom denies any pain with the latch.   LC examined Mom's breast no signs of trauma or breast compression. LC set Mom up on dEBP with flange size of 21 as comfortable fit.  Plan 1. LC reviewed how to reduce calorie loss for LPTI given infant birthweight under 6 lbs. Mom to keep hat on all times, feed 8-12x in 24 hr period no more than 3 hrs without an attempt, and keep total feeding under 30 min.   2 Mom to supplement with eBM after each latch with EBM via spoon 7- 10 ml.   3 Mom to pump with DEBP q 3hrs for 15 min  4. I and O sheet reviewed.  5. LC brochure of inpatient and outpatient services reviewed.  All questions answered at the end of the visit.     Maternal Data Has patient been taught Hand Expression?: Yes  Feeding Mother's Current Feeding Choice: Breast Milk  LATCH Score Latch: Repeated attempts needed to sustain latch, nipple held in mouth throughout feeding, stimulation needed to elicit sucking reflex.  Audible Swallowing: A few with stimulation  Type of Nipple: Flat  Comfort (Breast/Nipple): Soft / non-tender  Hold (Positioning): Assistance needed to correctly position infant at breast and maintain latch.  LATCH Score: 6   Lactation Tools Discussed/Used Tools: Pump;Flanges Flange Size: 21 Breast pump type: Double-Electric Breast Pump Pump Education: Setup, frequency, and cleaning;Milk Storage Reason for Pumping: increase stimulation Pumping frequency: every 3 hrs for 15 min  Interventions Interventions: Breast feeding basics reviewed;Support pillows;Education;Position options;Skin to skin;Expressed milk;Breast  massage;Hand express;DEBP;Breast compression  Discharge Pump: Personal WIC Program: No  Consult Status Consult Status: Follow-up Date: 03/19/21 Follow-up type: In-patient    Pamalee Marcoe  Nicholson-Springer 03/18/2021, 8:43 PM

## 2021-03-18 NOTE — Progress Notes (Signed)
Pt comfortable.  Vitals:   03/17/21 2000 03/17/21 2102 03/17/21 2158 03/17/21 2250  BP: 118/70 131/87 131/86 114/62  Pulse: 67 70 66 64  Resp:      Temp: 98 F (36.7 C)     TempSrc: Oral     SpO2:      Weight:      Height:       FHTS 120s, gSTV, NST R, cat 1 Toco occ SVE 3/50/-2 per nurse.  A\/P Term induction for GHTN, mild.  Continue, can start pitocin after last dose of cytotec.

## 2021-03-18 NOTE — Anesthesia Postprocedure Evaluation (Signed)
Anesthesia Post Note  Patient: Angela Lozano  Procedure(s) Performed: AN AD HOC LABOR EPIDURAL     Patient location during evaluation: Mother Baby Anesthesia Type: Epidural Level of consciousness: awake Pain management: satisfactory to patient Vital Signs Assessment: post-procedure vital signs reviewed and stable Respiratory status: spontaneous breathing Cardiovascular status: stable Anesthetic complications: no   No notable events documented.  Last Vitals:  Vitals:   03/18/21 1224 03/18/21 1359  BP: 128/69 132/76  Pulse: 82 (!) 112  Resp: 18 18  Temp: 37 C 37.2 C  SpO2: 100% 99%    Last Pain:  Vitals:   03/18/21 1403  TempSrc:   PainSc: 0-No pain   Pain Goal:                   [employer]

## 2021-03-18 NOTE — Progress Notes (Signed)
Patient seen and examined.  Just had epidural placed.  Still with right sided pain, but less.  She received three doses of cytotec overnight, last at 0230.   BP 118/65   Pulse 84   Temp 98 F (36.7 C) (Oral)   Resp 20   Ht 5\' 1"  (1.549 m)   Wt 93.9 kg   LMP 06/17/2020   SpO2 97%   BMI 39.11 kg/m   Toco: q2-3 minutes EFM: 130s, moderate variability, + scalp stimulation on exam SVE: 9.5/100/0  A/P: G1 @ [redacted]w[redacted]d with IOL for GHTN Rapid progression in active labor after 3rd dose of cytotec Anticipate SVD

## 2021-03-18 NOTE — Plan of Care (Signed)
Solmon Bohr, RN 

## 2021-03-18 NOTE — Lactation Note (Addendum)
This note was copied from a baby's chart. Lactation Consultation Note  Patient Name: Angela Lozano [id number] Date: 03/18/2021 Reason for consult: L&D Initial assessment Age:24 Hour  Mother is a P1, attempting to latch infant when I arrived to L&D.  Mother reports that infant has taken a few sucks several times, infant is 37 +1 weeks. He is 5-13 lbs Mother reports that she would like assistance with breastfeeding. Infant placed in football hold. Infant crying and rooting . Infant latched for short quicks sucks on and off . Tea cup hold used to get infant on to latch . Infant sustained latch for 15-20 mins. Mother and father observed infant swallowing . Basic teaching done. Informed mother that she would have available RN staff and [facility] staff to give more assistance. When she gets to her room.    Maternal Data Has patient been taught Hand Expression?: Yes  Feeding Mother's Current Feeding Choice: Breast Milk  LATCH Score Latch: Repeated attempts needed to sustain latch, nipple held in mouth throughout feeding, stimulation needed to elicit sucking reflex.  Audible Swallowing: Spontaneous and intermittent  Type of Nipple: Flat (firms with stimulation)  Comfort (Breast/Nipple): Soft / non-tender  Hold (Positioning): Assistance needed to correctly position infant at breast and maintain latch.  LATCH Score: 7   Lactation Tools Discussed/Used    Interventions Interventions: Assisted with latch;Skin to skin;Hand express;Breast compression;Adjust position;Support pillows;Position options;Education  Discharge    Consult Status Consult Status: Follow-up Date: 03/18/21 Follow-up type: In-patient    Stevan Born [facility] 03/18/2021, 1:10 PM

## 2021-03-18 NOTE — Anesthesia Procedure Notes (Signed)
Epidural Patient location during procedure: OB Start time: 03/18/2021 7:23 AM End time: 03/18/2021 7:29 AM  Staffing Anesthesiologist: Cecile Hearing, MD Performed: anesthesiologist   Preanesthetic Checklist Completed: patient identified, IV checked, risks and benefits discussed, monitors and equipment checked, pre-op evaluation and timeout performed  Epidural Patient position: sitting Prep: DuraPrep Patient monitoring: blood pressure and continuous pulse ox Approach: midline Location: L3-L4 Injection technique: LOR air  Needle:  Needle type: Tuohy  Needle gauge: 17 G Needle length: 9 cm Needle insertion depth: 6 cm Catheter size: 19 Gauge Catheter at skin depth: 11 cm Test dose: negative and Other (1% Lidocaine)  Additional Notes Patient identified.  Risk benefits discussed including failed block, incomplete pain control, headache, nerve damage, paralysis, blood pressure changes, nausea, vomiting, reactions to medication both toxic or allergic, and postpartum back pain.  Patient expressed understanding and wished to proceed.  All questions were answered.  Sterile technique used throughout procedure and epidural site dressed with sterile barrier dressing. No paresthesia or other complications noted. The patient did not experience any signs of intravascular injection such as tinnitus or metallic taste in mouth nor signs of intrathecal spread such as rapid motor block. Please see nursing notes for vital signs. Reason for block:procedure for pain

## 2021-03-19 LAB — CBC
HCT: 33 % — ABNORMAL LOW (ref 36.0–46.0)
Hemoglobin: 10.8 g/dL — ABNORMAL LOW (ref 12.0–15.0)
MCH: 27.7 pg (ref 26.0–34.0)
MCHC: 32.7 g/dL (ref 30.0–36.0)
MCV: 84.6 fL (ref 80.0–100.0)
Platelets: 217 10*3/uL (ref 150–400)
RBC: 3.9 MIL/uL (ref 3.87–5.11)
RDW: 13.7 % (ref 11.5–15.5)
WBC: 9.3 10*3/uL (ref 4.0–10.5)
nRBC: 0 % (ref 0.0–0.2)

## 2021-03-19 MED ORDER — IBUPROFEN 600 MG PO TABS: 600.0000 mg | ORAL_TABLET | Freq: Four times a day (QID) | ORAL | 0 refills | Status: DC

## 2021-03-19 NOTE — Lactation Note (Signed)
This note was copied from a baby's chart. Lactation Consultation Note  Patient Name: Angela Lozano [id number]'P Date: 03/19/2021 Reason for consult: Follow-up assessment;Early term 37-38.6wks;Primapara Age:24 hours  Mom was able to get infant to latch to the R breast. Infant suckled well, but consistently only swallowed q 5-7 sucks (swallows verified by cervical auscultation). Infant fed for 7 minutes. Mom agrees that sometimes infant falls asleep at the breast.   In light of infant's gestation & other risk factors, I recommended that Mom supplement with DBM. Mom unsure about staying an additional night b/c of insurance coverage; I spoke with others to help Mom find out information, but then discovered there would be no thawed DBM available today.   When I reentered Mom's room, she asked about switching to FO; I helped her reframe the situation & discussed doing both until her milk comes to volume.   Mom to call out for me to return. Dr. Erik Obey aware of the above. Lurline Hare [facility] 03/19/2021, 3:20 PM

## 2021-03-19 NOTE — Progress Notes (Signed)
Patient is doing well.  She is ambulating, voiding, tolerating PO.  Pain control is good.  Lochia is appropriate  Vitals:   03/18/21 1359 03/18/21 1811 03/18/21 2205 03/19/21 0529  BP: 132/76 131/80 131/77 126/75  Pulse: (!) 112 (!) 113 97 99  Resp: 18 18 16 18   Temp: 99 F (37.2 C) 98.9 F (37.2 C) 98.3 F (36.8 C) 97.6 F (36.4 C)  TempSrc: Oral Oral Oral Oral  SpO2: 99% 99%    Weight:      Height:        NAD Fundus firm Ext: trace LE edema bilaterally  Lab Results  Component Value Date   WBC 9.3 03/19/2021   HGB 10.8 (L) 03/19/2021   HCT 33.0 (L) 03/19/2021   MCV 84.6 03/19/2021   PLT 217 03/19/2021    --/--/A POS (07/14 1618)/RImmune  A/P 24 y.o. G1P1001 PPD#1 s/p SVD at 37wks for GHTN GHTN:  BPs normal since delivery Strongly desires discharge to home today.  She is meeting all goals.  Would not be unreasonable to discharge today with close f/u in office, though uncertain if baby will be ready for discharge at 24 hours  [facility] GEFFEL [facility]

## 2021-03-19 NOTE — Lactation Note (Signed)
This note was copied from a baby's chart. Lactation Consultation Note  Patient Name: Angela Lozano [id number]'P Date: 03/19/2021 Reason for consult: Follow-up assessment;Early term 37-38.6wks;Primapara Age:24 hours  "[name]" bottle-fed well. Parents were given the hand-out "Formula feeding your baby" to show volume guidelines for intake.  Mom was shown how to wash her pump parts & how to use the hand pump from the DEBP kit. Mom also has a Motif pump at home, but is aware she may need to order size 21 flanges if they did not come with her pump.  Formula preparation discussed along with expectations for breast management. Mom's questions were answered to her satisfaction. She also knows how to get in touch with [place], if needed.   Dr. Abelina Bachelor came into room during final [facility] consult. Dr. Abelina Bachelor to discharge infant home today.   Matthias Hughs [facility] 03/19/2021, 5:13 PM

## 2021-03-19 NOTE — Discharge Summary (Signed)
Postpartum Discharge Summary  Date of Service updated 03/19/21     Patient Name: Angela Lozano DOB: 07/23/1996 MRN: [id number]  Date of admission: 03/17/2021 Delivery date:03/18/2021  Delivering provider: Marlow Baars  Date of discharge: 03/19/2021  Admitting diagnosis: Normal labor and delivery [O80] Intrauterine pregnancy: [redacted]w[redacted]d     Secondary diagnosis:  Active Problems:   Normal labor and delivery  Additional problems: GHTN    Discharge diagnosis: Term Pregnancy Delivered and Gestational Hypertension                                              Post partum procedures: none Augmentation: Cytotec Complications: None  Hospital course: Induction of Labor With Vaginal Delivery   25 y.o. yo G1P1001 at [redacted]w[redacted]d was admitted to the hospital 03/17/2021 for induction of labor.  Indication for induction: Gestational hypertension.  Patient had an uncomplicated labor course as follows: Membrane Rupture Time/Date: 4:00 AM ,03/18/2021   Delivery Method:Vaginal, Spontaneous  Episiotomy: None  Lacerations:  Vaginal  Details of delivery can be found in separate delivery note.  Patient had a routine postpartum course. Patient is discharged home 03/19/21.  Newborn Data: Birth date:03/18/2021  Birth time:10:22 AM  Gender:Female  Living status:Living  Apgars:9 ,9  Weight:2656 g   Magnesium Sulfate received: No   Physical exam  Vitals:   03/18/21 1811 03/18/21 2205 03/19/21 0529 03/19/21 1424  BP: 131/80 131/77 126/75 135/78  Pulse: (!) 113 97 99 89  Resp: 18 16 18 18   Temp: 98.9 F (37.2 C) 98.3 F (36.8 C) 97.6 F (36.4 C) 98.7 F (37.1 C)  TempSrc: Oral Oral Oral Oral  SpO2: 99%   100%  Weight:      Height:       General: alert, cooperative, and no distress Lochia: appropriate Uterine Fundus: firm Incision: N/A DVT Evaluation: No evidence of DVT seen on physical exam. No significant calf/ankle edema. Labs: Lab Results  Component Value Date   WBC 9.3 03/19/2021    HGB 10.8 (L) 03/19/2021   HCT 33.0 (L) 03/19/2021   MCV 84.6 03/19/2021   PLT 217 03/19/2021   CMP Latest Ref Rng & Units 03/18/2021  Glucose 70 - 99 mg/dL 82  BUN 6 - 20 mg/dL 03/20/2021)  Creatinine <9(E - 1.00 mg/dL 1.74  Sodium 0.81 - 448 mmol/L 135  Potassium 3.5 - 5.1 mmol/L 3.7  Chloride 98 - 111 mmol/L 105  CO2 22 - 32 mmol/L 22  Calcium 8.9 - 10.3 mg/dL 8.9  Total Protein 6.5 - 8.1 g/dL 6.0(L)  Total Bilirubin 0.3 - 1.2 mg/dL 0.6  Alkaline Phos 38 - 126 U/L 165(H)  AST 15 - 41 U/L 18  ALT 0 - 44 U/L 16   Edinburgh Score: Edinburgh Postnatal Depression Scale Screening Tool 03/19/2021  I have been able to laugh and see the funny side of things. 0  I have looked forward with enjoyment to things. 0  I have blamed myself unnecessarily when things went wrong. 0  I have been anxious or worried for no good reason. 0  I have felt scared or panicky for no good reason. 0  Things have been getting on top of me. 0  I have been so unhappy that I have had difficulty sleeping. 0  I have felt sad or miserable. 0  I have been so unhappy that I have been  crying. 0  The thought of harming myself has occurred to me. 0  Edinburgh Postnatal Depression Scale Total 0      After visit meds:  Allergies as of 03/19/2021   No Known Allergies      Medication List     STOP taking these medications    aspirin EC 81 MG tablet       TAKE these medications    ibuprofen 600 MG tablet Commonly known as: ADVIL Take 1 tablet (600 mg total) by mouth every 6 (six) hours. Start taking on: March 20, 2021   prenatal multivitamin Tabs tablet Take 1 tablet by mouth daily at 12 noon.         Discharge home in stable condition Infant Feeding: Breast Infant Disposition:home with mother Discharge instruction: per After Visit Summary and Postpartum booklet. Activity: Advance as tolerated. Pelvic rest for 6 weeks.  Diet: routine diet Anticipated Birth Control: Unsure Postpartum Appointment:4  weeks Additional Postpartum F/U: BP check 3-5 days Future Appointments:No future appointments. Follow up Visit:  Follow-up Information     Marlow Baars, MD Follow up.   Specialty: Obstetrics Why: 3-5 days for BP check Contact information: [address] [place] [address] [phone]                     03/19/2021 [facility] GEFFEL [name], MD

## 2021-03-19 NOTE — Lactation Note (Signed)
This note was copied from a baby's chart. Lactation Consultation Note  Patient Name: Angela Lozano [id number] Date: 03/19/2021   Age:24 hours  When I entered room, infant was latched & suckling, but it was apparent that some improvements could be made with positioning. As I assisted in optimizing positioning, infant unlatched & fell asleep. At that point, infant had already been feeding for 25 minutes.   Mom had questions, which I answered. Mom is willing to call for me to return to observe the next feeding.   Mom reported having pumped at noon; she obtained less than 1 mL. I did hand expression with her and a few drops were readily visible on her R breast.    Lurline Hare [facility] 03/19/2021, 12:56 PM

## 2021-03-31 ENCOUNTER — Telehealth (HOSPITAL_COMMUNITY): Payer: Self-pay | Admitting: *Deleted

## 2021-03-31 NOTE — Telephone Encounter (Signed)
Left message to return nurse call.  Duffy Rhody, RN 03/31/2021 at 9:13am

## 2021-04-15 ENCOUNTER — Emergency Department (HOSPITAL_COMMUNITY)
Admission: EM | Admit: 2021-04-15 | Discharge: 2021-04-16 | Disposition: A | Discharge status: Home / Self Care | Payer: BC Managed Care – PPO | Attending: Emergency Medicine | Admitting: Emergency Medicine

## 2021-04-15 ENCOUNTER — Other Ambulatory Visit: Payer: Self-pay

## 2021-04-15 ENCOUNTER — Encounter (HOSPITAL_COMMUNITY): Payer: Self-pay | Admitting: Emergency Medicine

## 2021-04-15 ENCOUNTER — Emergency Department (HOSPITAL_COMMUNITY): Payer: BC Managed Care – PPO

## 2021-04-15 DIAGNOSIS — R109 Unspecified abdominal pain: Secondary | ICD-10-CM | POA: Diagnosis present

## 2021-04-15 DIAGNOSIS — K8050 Calculus of bile duct without cholangitis or cholecystitis without obstruction: Secondary | ICD-10-CM | POA: Insufficient documentation

## 2021-04-15 LAB — CBC WITH DIFFERENTIAL/PLATELET
Abs Immature Granulocytes: 0.02 10*3/uL (ref 0.00–0.07)
Basophils Absolute: 0 10*3/uL (ref 0.0–0.1)
Basophils Relative: 0 %
Eosinophils Absolute: 0.1 10*3/uL (ref 0.0–0.5)
Eosinophils Relative: 1 %
HCT: 40.2 % (ref 36.0–46.0)
Hemoglobin: 13 g/dL (ref 12.0–15.0)
Immature Granulocytes: 0 %
Lymphocytes Relative: 48 %
Lymphs Abs: 4 10*3/uL (ref 0.7–4.0)
MCH: 26.7 pg (ref 26.0–34.0)
MCHC: 32.3 g/dL (ref 30.0–36.0)
MCV: 82.5 fL (ref 80.0–100.0)
Monocytes Absolute: 0.4 10*3/uL (ref 0.1–1.0)
Monocytes Relative: 5 %
Neutro Abs: 3.8 10*3/uL (ref 1.7–7.7)
Neutrophils Relative %: 46 %
Platelets: 298 10*3/uL (ref 150–400)
RBC: 4.87 MIL/uL (ref 3.87–5.11)
RDW: 13.9 % (ref 11.5–15.5)
WBC: 8.4 10*3/uL (ref 4.0–10.5)
nRBC: 0 % (ref 0.0–0.2)

## 2021-04-15 LAB — I-STAT BETA HCG BLOOD, ED (MC, WL, AP ONLY): I-stat hCG, quantitative: <5 m[IU]/mL (ref <5)

## 2021-04-15 LAB — COMPREHENSIVE METABOLIC PANEL
ALT: 28 U/L (ref 0–44)
AST: 35 U/L (ref 15–41)
Albumin: 4.1 g/dL (ref 3.5–5.0)
Alkaline Phosphatase: 72 U/L (ref 38–126)
Anion gap: 11 (ref 5–15)
BUN: 13 mg/dL (ref 6–20)
CO2: 24 mmol/L (ref 22–32)
Calcium: 9.4 mg/dL (ref 8.9–10.3)
Chloride: 105 mmol/L (ref 98–111)
Creatinine, Ser: 0.45 mg/dL (ref 0.44–1.00)
GFR, Estimated: >60 mL/min (ref >60)
Glucose, Bld: 116 mg/dL — ABNORMAL HIGH (ref 70–99)
Potassium: 3.4 mmol/L — ABNORMAL LOW (ref 3.5–5.1)
Sodium: 140 mmol/L (ref 135–145)
Total Bilirubin: 0.6 mg/dL (ref 0.3–1.2)
Total Protein: 7.4 g/dL (ref 6.5–8.1)

## 2021-04-15 LAB — LIPASE, BLOOD: Lipase: 31 U/L (ref 11–51)

## 2021-04-15 MED ORDER — OXYCODONE-ACETAMINOPHEN 5-325 MG PO TABS
1.0000 | ORAL_TABLET | Freq: Once | ORAL | Status: AC
  Administered 2021-04-15: 1 via ORAL
  Filled 2021-04-15: qty 1

## 2021-04-15 MED ORDER — ONDANSETRON 4 MG PO TBDP
4.0000 mg | ORAL_TABLET | Freq: Once | ORAL | Status: AC
  Administered 2021-04-15: 4 mg via ORAL
  Filled 2021-04-15: qty 1

## 2021-04-15 NOTE — ED Triage Notes (Signed)
Pt reports mid back pain at 10/10 on both sides of her back. Pt has hx of gallstones. Pt reports no nausea and no other history

## 2021-04-15 NOTE — ED Provider Notes (Signed)
Emergency Medicine Provider Triage Evaluation Note  Angela Lozano , a 25 y.o. female  was evaluated in triage.  Pt complains of right flank pain x 1 hour ago. Has not taken anything for pain. Reports this feels like a "gallbladder attack" like in the past. No MAT  Review of Systems  Positive: RUQ pain, nausea,  Negative: Vomiting, diarrhea, fever, urinary symptoms  Physical Exam  BP 112/85 (BP Location: Right Arm)   Pulse 77   Temp 98.1 F (36.7 C) (Oral)   Resp 20   Ht 5\' 1"  (1.549 m)   Wt 93.9 kg   SpO2 96%   BMI 39.11 kg/m  Gen:   Awake, no distress  appears uncomfortable  Resp:  Normal effort  MSK:   Moves extremities without difficulty  Other:    Medical Decision Making  Medically screening exam initiated at 11:01 PM.  Appropriate orders placed.  Angela Lozano was informed that the remainder of the evaluation will be completed by another provider, this initial triage assessment does not replace that evaluation, and the importance of remaining in the ED until their evaluation is complete.  Patient with flank pain bilaterally on exam, previous visits for cholelithiasis with no urinary symptoms. Labs along with pain control have been ordered.    Roxanne Mins, PA-C 04/15/21 2303    2304, MD 04/16/21 1500

## 2021-04-16 LAB — URINALYSIS, ROUTINE W REFLEX MICROSCOPIC
Bilirubin Urine: NEGATIVE
Glucose, UA: NEGATIVE mg/dL
Ketones, ur: NEGATIVE mg/dL
Nitrite: NEGATIVE
Protein, ur: 30 mg/dL — AB
Specific Gravity, Urine: 1.015 (ref 1.005–1.030)
pH: 8.5 — ABNORMAL HIGH (ref 5.0–8.0)

## 2021-04-16 LAB — URINALYSIS, MICROSCOPIC (REFLEX)

## 2021-04-16 NOTE — ED Provider Notes (Signed)
[facility]-EMERGENCY DEPT Provider Note   CSN: [id number] Arrival date & time: 04/15/21  2219     History Chief Complaint  Patient presents with   Flank Pain    Angela Lozano is a 25 y.o. female.  The history is provided by the patient.  Flank Pain This is a new problem. The problem occurs constantly. The problem has been gradually improving. Associated symptoms include abdominal pain. Pertinent negatives include no chest pain and no shortness of breath. Exacerbated by: movement. The symptoms are relieved by rest.  Patient presents with right flank pain.  Patient reports this occurred about 1 hour after eating donuts and coffee.  She also reports upper abdominal pain.  At times the pain goes into her right shoulder.  No chest pain or shortness of breath.  No fevers or vomiting.  She is now feeling improved.    Past Medical History:  Diagnosis Date   Gallstone    Medical history non-contributory     Patient Active Problem List   Diagnosis Date Noted   Normal labor and delivery 03/17/2021    Past Surgical History:  Procedure Laterality Date   NO PAST SURGERIES       OB History     Gravida  1   Para  1   Term  1   Preterm      AB      Living  1      SAB      IAB      Ectopic      Multiple  0   Live Births  1           Family History  Problem Relation Age of Onset   Miscarriages / [place] Mother     Social History   Tobacco Use   Smoking status: Never   Smokeless tobacco: Never  Vaping Use   Vaping Use: Never used  Substance Use Topics   Alcohol use: Not Currently   Drug use: Never    Home Medications Prior to Admission medications   Not on File    Allergies    Patient has no known allergies.  Review of Systems   Review of Systems  Constitutional:  Negative for fever.  Respiratory:  Negative for shortness of breath.   Cardiovascular:  Negative for chest pain.  Gastrointestinal:   Positive for abdominal pain.  Genitourinary:  Positive for flank pain. Negative for vaginal bleeding.  All other systems reviewed and are negative.  Physical Exam Updated Vital Signs BP 130/73 (BP Location: Left Arm)   Pulse 85   Temp 98.2 F (36.8 C) (Oral)   Resp 16   Ht 1.549 m (5\' 1" )   Wt 93.9 kg   SpO2 100%   BMI 39.11 kg/m   Physical Exam CONSTITUTIONAL: Well developed/well nourished HEAD: Normocephalic/atraumatic EYES: EOMI/PERRL, no icterus ENMT: Mucous membranes moist NECK: supple no meningeal signs SPINE/BACK:entire spine nontender CV: S1/S2 noted, no murmurs/rubs/gallops noted LUNGS: Lungs are clear to auscultation bilaterally, no apparent distress ABDOMEN: soft, nontender, no rebound or guarding, bowel sounds noted throughout abdomen GU:no cva tenderness NEURO: Pt is awake/alert/appropriate, moves all extremitiesx4.  No facial droop.   EXTREMITIES: pulses normal/equal, full ROM SKIN: warm, color normal PSYCH: no abnormalities of mood noted, alert and oriented to situation  ED Results / Procedures / Treatments   Labs (all labs ordered are listed, but only abnormal results are displayed) Labs Reviewed  URINALYSIS, ROUTINE W REFLEX MICROSCOPIC - Abnormal; Notable  for the following components:      Result Value   pH 8.5 (*)    Hgb urine dipstick LARGE (*)    Protein, ur 30 (*)    Leukocytes,Ua MODERATE (*)    All other components within normal limits  COMPREHENSIVE METABOLIC PANEL - Abnormal; Notable for the following components:   Potassium 3.4 (*)    Glucose, Bld 116 (*)    All other components within normal limits  URINALYSIS, MICROSCOPIC (REFLEX) - Abnormal; Notable for the following components:   Bacteria, UA FEW (*)    All other components within normal limits  CBC WITH DIFFERENTIAL/PLATELET  LIPASE, BLOOD  I-STAT BETA HCG BLOOD, ED ([facility], [facility], [facility] ONLY)    EKG None  Radiology CT Renal Stone Study  Result Date: 04/16/2021 CLINICAL DATA:  Flank  pain EXAM: CT ABDOMEN AND PELVIS WITHOUT CONTRAST TECHNIQUE: Multidetector CT imaging of the abdomen and pelvis was performed following the standard protocol without IV contrast. COMPARISON:  02/20/2021 FINDINGS: Lower chest: No acute abnormality. Hepatobiliary: No focal liver abnormality is seen. No gallstones, gallbladder wall thickening, or biliary dilatation. Pancreas: Unremarkable. No pancreatic ductal dilatation or surrounding inflammatory changes. Spleen: Normal in size without focal abnormality. Adrenals/Urinary Tract: Adrenal glands are normal limits. Kidneys are well visualized bilaterally and no renal calculi or urinary tract obstructive changes are seen. Ureters are within normal limits. Bladder is well distended. Stomach/Bowel: The appendix is within normal limits. No obstructive or inflammatory changes of the colon are noted. Small bowel and stomach are unremarkable. Vascular/Lymphatic: No significant vascular findings are present. No enlarged abdominal or pelvic lymph nodes. Reproductive: Uterus and bilateral adnexa are unremarkable. Other: No abdominal wall hernia or abnormality. No abdominopelvic ascites. Musculoskeletal: No acute or significant osseous findings. IMPRESSION: No acute abnormality noted. Electronically Signed   By: Alcide Clever M.D.   On: 04/16/2021 00:05    Procedures Procedures   Medications Ordered in ED Medications  oxyCODONE-acetaminophen (PERCOCET/ROXICET) 5-325 MG per tablet 1 tablet (1 tablet Oral Given 04/15/21 2317)  ondansetron (ZOFRAN-ODT) disintegrating tablet 4 mg (4 mg Oral Given 04/15/21 2317)    ED Course  I have reviewed the triage vital signs and the nursing notes.  Pertinent labs  results that were available during my care of the patient were reviewed by me and considered in my medical decision making (see chart for details).    MDM Rules/Calculators/A&P                           Patient with known history of cholelithiasis presents with flank  pain and abdominal pain that feels similar to previous episodes of biliary colic.  She is now pain-free.  CT imaging has been ordered to evaluate for kidney stones and it was negative.  Previous ultrasound imaging has confirmed cholelithiasis.  This was diagnosed while pregnant, and she delivered last month. Will refer her to general surgery now that  she is  not pregnant. Suspect this is an episode of biliary colic Final Clinical Impression(s) / ED Diagnoses Final diagnoses:  Biliary colic    Rx / DC Orders ED Discharge Orders     None        Zadie Rhine, MD 04/16/21 [phone]

## 2021-05-20 ENCOUNTER — Ambulatory Visit: Payer: Self-pay | Admitting: Surgery

## 2021-05-20 NOTE — H&P (Signed)
Angela Lozano [id number]    Referring Provider:  Self     Subjective    Chief Complaint: Abdominal pain     History of Present Illness:      Very pleasant and otherwise healthy 25 year old woman who is about 2 months postpartum presents for evaluation of intermittent right upper quadrant pain which began when she was about [redacted] weeks pregnant.  She had an ultrasound at that time confirming gallstones.  She did well throughout her pregnancy, but notes that for the last 4 or 5 weeks she has had about 1 attack a week lasting anywhere from 90 minutes to 8 hours.  The pain is in the epigastrium, right upper quadrant and radiates to the back.  Associated with nausea but no vomiting.  Pain is instigated by eating, even fat-free foods including chicken broth.     Review of Systems: A complete review of systems was obtained from the patient.  I have reviewed this information and discussed as appropriate with the patient.  See HPI as well for other ROS.     Medical History: Past Medical History      Past Medical History:  Diagnosis Date   Acne             Patient Active Problem List  Diagnosis   BMI (body mass index), pediatric, 85% to less than 95% for age      Past Surgical History  History reviewed. No pertinent surgical history.      Allergies  No Known Allergies           Current Outpatient Medications on File Prior to Visit  Medication Sig Dispense Refill   adapalene 0.3 % GlwP once daily (Patient not taking: Reported on 12/15/2020)   3   aspirin 81 MG EC tablet Take by mouth       prenatal 25/iron fum/folic/dha (PRENATAL-1 ORAL) Take by mouth        No current facility-administered medications on file prior to visit.      Family History       Family History  Problem Relation Age of Onset   No Known Problems Mother     High blood pressure (Hypertension) Father     Hyperlipidemia (Elevated cholesterol) Father     Diabetes type II Maternal Grandmother           Social History       Tobacco Use  Smoking Status Never Smoker  Smokeless Tobacco Never Used      Social History  Social History         Socioeconomic History   Marital status: Single  Tobacco Use   Smoking status: Never Smoker   Smokeless tobacco: Never Used  Building services engineer Use: Never used  Substance and Sexual Activity   Alcohol use: No   Drug use: No   Sexual activity: Yes      Partners: Male      Birth control/protection: None  Social History Narrative    Lives with parents and brother. Wears seatbelt. Dad smokes outside the home. No firearms.         Objective:         Vitals:    05/20/21 1050  Pulse: 88  Temp: 36.8 C (98.3 F)  SpO2: 99%  Weight: 85.4 kg (188 lb 3.2 oz)  Height: 154.9 cm (5\' 1" )    Body mass index is 35.56 kg/m.   Alert, well-appearing Extraocular motions intact, no scleral icterus Unlabored respirations Abdomen  soft, nondistended, mildly tender in the epigastrium   Assessment and Plan:  Diagnoses and all orders for this visit:   Biliary colic       I recommend proceeding with laparoscopic or robotic cholecystectomy with possible cholangiogram.  Discussed the relevant anatomy using a diagram to demonstrate, and went over surgical technique.  Discussed risks of surgery including bleeding, pain, scarring, intraabdominal injury specifically to the common bile duct and sequelae, bile leak, conversion to open surgery, failure to resolve symptoms, blood clots/ pulmonary embolus, heart attack, pneumonia, stroke, death. Questions welcomed and answered to patient's satisfaction.  Schedule as soon as possible.   Blakelyn Dinges Carlye Grippe, MD

## 2021-05-20 NOTE — H&P (View-Only) (Signed)
Marua Qin [id number]    Referring Provider:  Self     Subjective    Chief Complaint: Abdominal pain     History of Present Illness:      Very pleasant and otherwise healthy 25 year old woman who is about 2 months postpartum presents for evaluation of intermittent right upper quadrant pain which began when she was about [redacted] weeks pregnant.  She had an ultrasound at that time confirming gallstones.  She did well throughout her pregnancy, but notes that for the last 4 or 5 weeks she has had about 1 attack a week lasting anywhere from 90 minutes to 8 hours.  The pain is in the epigastrium, right upper quadrant and radiates to the back.  Associated with nausea but no vomiting.  Pain is instigated by eating, even fat-free foods including chicken broth.     Review of Systems: A complete review of systems was obtained from the patient.  I have reviewed this information and discussed as appropriate with the patient.  See HPI as well for other ROS.     Medical History: Past Medical History      Past Medical History:  Diagnosis Date   Acne             Patient Active Problem List  Diagnosis   BMI (body mass index), pediatric, 85% to less than 95% for age      Past Surgical History  History reviewed. No pertinent surgical history.      Allergies  No Known Allergies           Current Outpatient Medications on File Prior to Visit  Medication Sig Dispense Refill   adapalene 0.3 % GlwP once daily (Patient not taking: Reported on 12/15/2020)   3   aspirin 81 MG EC tablet Take by mouth       prenatal 25/iron fum/folic/dha (PRENATAL-1 ORAL) Take by mouth        No current facility-administered medications on file prior to visit.      Family History       Family History  Problem Relation Age of Onset   No Known Problems Mother     High blood pressure (Hypertension) Father     Hyperlipidemia (Elevated cholesterol) Father     Diabetes type II Maternal Grandmother           Social History       Tobacco Use  Smoking Status Never Smoker  Smokeless Tobacco Never Used      Social History  Social History         Socioeconomic History   Marital status: Single  Tobacco Use   Smoking status: Never Smoker   Smokeless tobacco: Never Used  Building services engineer Use: Never used  Substance and Sexual Activity   Alcohol use: No   Drug use: No   Sexual activity: Yes      Partners: Male      Birth control/protection: None  Social History Narrative    Lives with parents and brother. Wears seatbelt. Dad smokes outside the home. No firearms.         Objective:         Vitals:    05/20/21 1050  Pulse: 88  Temp: 36.8 C (98.3 F)  SpO2: 99%  Weight: 85.4 kg (188 lb 3.2 oz)  Height: 154.9 cm (5\' 1" )    Body mass index is 35.56 kg/m.   Alert, well-appearing Extraocular motions intact, no scleral icterus Unlabored respirations Abdomen  soft, nondistended, mildly tender in the epigastrium   Assessment and Plan:  Diagnoses and all orders for this visit:   Biliary colic       I recommend proceeding with laparoscopic or robotic cholecystectomy with possible cholangiogram.  Discussed the relevant anatomy using a diagram to demonstrate, and went over surgical technique.  Discussed risks of surgery including bleeding, pain, scarring, intraabdominal injury specifically to the common bile duct and sequelae, bile leak, conversion to open surgery, failure to resolve symptoms, blood clots/ pulmonary embolus, heart attack, pneumonia, stroke, death. Questions welcomed and answered to patient's satisfaction.  Schedule as soon as possible.   Aylissa Heinemann Carlye Grippe, MD

## 2021-05-30 NOTE — Patient Instructions (Addendum)
DUE TO COVID-19 ONLY ONE VISITOR IS ALLOWED TO COME WITH YOU AND STAY IN THE WAITING ROOM ONLY DURING PRE OP AND PROCEDURE.   **NO VISITORS ARE ALLOWED IN THE SHORT STAY AREA OR RECOVERY ROOM!!**   Your procedure is scheduled on: Monday, Oct. 3, 2022   Report to [facility] Main Entrance    Report to admitting at 11:30 AM   Call this number if you have problems the morning of surgery [phone]   Do not eat food :After Midnight.   May have liquids until 10:30 AM   day of surgery  CLEAR LIQUID DIET  Foods Allowed                                                                     Foods Excluded  Water, Black Coffee and tea (no milk or creamer)                         liquids that you cannot  Plain Jell-O in any flavor  (No red)                                           see through such as: Fruit ices (not with fruit pulp)                                     milk, soups, orange juice              Iced Popsicles (No red)                                    All solid food                                   Apple juices Sports drinks like Gatorade (No red) Lightly seasoned clear broth or consume(fat free) Sugar  Oral Hygiene is also important to reduce your risk of infection.                                    Remember - BRUSH YOUR TEETH THE MORNING OF SURGERY WITH YOUR REGULAR TOOTHPASTE   Take these medicines the morning of surgery with A SIP OF WATER: None                              You may not have any metal on your body including hair pins, jewelry, and body piercing             Do not wear make-up, lotions, powders, perfumes/cologne, or deodorant  Do not wear nail polish including gel and S&S, artificial/acrylic nails, or any other type of covering on natural nails including finger and toenails. If you have artificial nails, gel coating, etc. that needs to  be removed by a nail salon please have this removed prior to surgery or surgery may need to be canceled/ delayed  if the surgeon/ anesthesia feels like they are unable to be safely monitored.   Do not shave  48 hours prior to surgery.    Do not bring valuables to the hospital.  IS NOT             RESPONSIBLE   FOR VALUABLES.   Patients discharged on the day of surgery will not be allowed to drive home.  Special Instructions: Bring a copy of your healthcare power of attorney and living will documents         the day of surgery if you haven't scanned them before.   Please read over the following fact sheets you were given: IF YOU HAVE QUESTIONS ABOUT YOUR PRE-OP INSTRUCTIONS PLEASE CALL [phone]- [facility] - Preparing for Surgery Before surgery, you can play an important role.  Because skin is not sterile, your skin needs to be as free of germs as possible.  You can reduce the number of germs on your skin by washing with CHG (chlorahexidine gluconate) soap before surgery.  CHG is an antiseptic cleaner which kills germs and bonds with the skin to continue killing germs even after washing. Please DO NOT use if you have an allergy to CHG or antibacterial soaps.  If your skin becomes reddened/irritated stop using the CHG and inform your nurse when you arrive at Short Stay. Do not shave (including legs and underarms) for at least 48 hours prior to the first CHG shower.  You may shave your face/neck.  Please follow these instructions carefully:  1.  Shower with CHG Soap the night before surgery and the  morning of surgery.  2.  If you choose to wash your hair, wash your hair first as usual with your normal  shampoo.  3.  After you shampoo, rinse your hair and body thoroughly to remove the shampoo.                             4.  Use CHG as you would any other liquid soap.  You can apply chg directly to the skin and wash.  Gently with a scrungie or clean washcloth.  5.  Apply the CHG Soap to your body ONLY FROM THE NECK DOWN.   Do   not use on face/ open                           Wound  or open sores. Avoid contact with eyes, ears mouth and   genitals (private parts).                       Wash face,  Genitals (private parts) with your normal soap.             6.  Wash thoroughly, paying special attention to the area where your    surgery  will be performed.  7.  Thoroughly rinse your body with warm water from the neck down.  8.  DO NOT shower/wash with your normal soap after using and rinsing off the CHG Soap.                9.  Pat yourself dry with a clean towel.  10.  Wear clean pajamas.            11.  Place clean sheets on your bed the night of your first shower and do not  sleep with pets. Day of Surgery : Do not apply any lotions/deodorants the morning of surgery.  Please wear clean clothes to the hospital/surgery center.  FAILURE TO FOLLOW THESE INSTRUCTIONS MAY RESULT IN THE CANCELLATION OF YOUR SURGERY  PATIENT SIGNATURE_________________________________  NURSE SIGNATURE__________________________________  ________________________________________________________________________

## 2021-05-31 ENCOUNTER — Other Ambulatory Visit: Payer: Self-pay

## 2021-05-31 ENCOUNTER — Encounter (HOSPITAL_COMMUNITY): Payer: Self-pay

## 2021-05-31 ENCOUNTER — Encounter (HOSPITAL_COMMUNITY)
Admission: RE | Admit: 2021-05-31 | Discharge: 2021-05-31 | Disposition: A | Discharge status: Home / Self Care | Payer: BC Managed Care – PPO | Source: Ambulatory Visit | Attending: Surgery | Admitting: Surgery

## 2021-05-31 DIAGNOSIS — Z01812 Encounter for preprocedural laboratory examination: Secondary | ICD-10-CM | POA: Insufficient documentation

## 2021-05-31 HISTORY — DX: Unspecified pre-eclampsia, unspecified trimester: O14.90

## 2021-05-31 NOTE — Progress Notes (Signed)
COVID swab appointment: n/a  COVID Vaccine Completed: yes x1 Date COVID Vaccine completed: Has received booster: COVID vaccine manufacturer: Pfizer    [employer]   Date of COVID positive in last 90 days: no  PCP - No Cardiologist -   Chest x-ray - N/a EKG - N/a Stress Test - yes while pregnant 2 months ago, [facility], requested ECHO - yes in April Cardiac Cath - N/a Pacemaker/ICD device last checked: N/a Spinal Cord Stimulator: N/a  Sleep Study - N/a CPAP -   Fasting Blood Sugar - N/a Checks Blood Sugar _____ times a day  Blood Thinner Instructions: N/a Aspirin Instructions: Last Dose:  Activity level: Can go up a flight of stairs and perform activities of daily living without stopping and without symptoms of chest pain or shortness of breath.       Anesthesia review:   Patient denies shortness of breath, fever, cough and chest pain at PAT appointment   Patient verbalized understanding of instructions that were given to them at the PAT appointment. Patient was also instructed that they will need to review over the PAT instructions again at home before surgery.

## 2021-06-06 ENCOUNTER — Encounter (HOSPITAL_COMMUNITY): Payer: Self-pay | Admitting: Certified Registered"

## 2021-06-06 ENCOUNTER — Encounter (HOSPITAL_COMMUNITY)
Admission: RE | Disposition: A | Discharge status: Home / Self Care | Payer: Self-pay | Source: Home / Self Care | Attending: Surgery

## 2021-06-06 ENCOUNTER — Ambulatory Visit (HOSPITAL_COMMUNITY)
Admission: RE | Admit: 2021-06-06 | Discharge: 2021-06-06 | Disposition: A | Discharge status: Home / Self Care | Payer: BC Managed Care – PPO

## 2021-06-06 SURGERY — LAPAROSCOPIC CHOLECYSTECTOMY
Anesthesia: General

## 2021-06-09 ENCOUNTER — Encounter (HOSPITAL_COMMUNITY): Payer: Self-pay | Admitting: Surgery

## 2021-06-09 ENCOUNTER — Other Ambulatory Visit: Payer: Self-pay

## 2021-06-09 NOTE — Progress Notes (Signed)
Angela Lozano denies chest pain or shortness of breath.  Patient denies having any s/s of Covid in her household.  Patient denies any known exposure to Covid.  Angela Lozano had HTN during pregnancy,blood pressure is normal per patient, now.  Angela Lozano has never had an Echo or stress test for her, patient had one on her baby when she was pregnant. Patient does not have a PCP or cardiologist.  I instructed patient to shower with antibiotic soap, if it is available.  Dry off with a clean towel. Do not put lotion, powder, cologne or deodorant or makeup.No jewelry or piercings. Men may shave their face and neck. Woman should not shave. No nail polish, artificial or acrylic nails. Wear clean clothes, brush your teeth. Glasses, contact lens,dentures or partials may not be worn in the OR. If you need to wear them, please bring a case for glasses, do not wear contacts or bring a case, the hospital does not have contact cases, dentures or partials will have to be removed , make sure they are clean, we will provide a denture cup to put them in. You will need some one to drive you home and a responsible person over the age of 9 to stay with you for the first 24 hours after surgery.

## 2021-06-10 ENCOUNTER — Ambulatory Visit (HOSPITAL_COMMUNITY): Payer: BC Managed Care – PPO | Admitting: Emergency Medicine

## 2021-06-10 ENCOUNTER — Ambulatory Visit (HOSPITAL_COMMUNITY)
Admission: RE | Admit: 2021-06-10 | Discharge: 2021-06-10 | Disposition: A | Discharge status: Home / Self Care | Payer: BC Managed Care – PPO | Source: Ambulatory Visit | Attending: Surgery | Admitting: Surgery

## 2021-06-10 ENCOUNTER — Encounter (HOSPITAL_COMMUNITY)
Admission: RE | Disposition: A | Discharge status: Home / Self Care | Payer: Self-pay | Source: Ambulatory Visit | Attending: Surgery

## 2021-06-10 ENCOUNTER — Other Ambulatory Visit: Payer: Self-pay

## 2021-06-10 ENCOUNTER — Encounter (HOSPITAL_COMMUNITY): Payer: Self-pay | Admitting: Surgery

## 2021-06-10 DIAGNOSIS — K8010 Calculus of gallbladder with chronic cholecystitis without obstruction: Secondary | ICD-10-CM | POA: Insufficient documentation

## 2021-06-10 DIAGNOSIS — O9963 Diseases of the digestive system complicating the puerperium: Secondary | ICD-10-CM | POA: Insufficient documentation

## 2021-06-10 DIAGNOSIS — Z7982 Long term (current) use of aspirin: Secondary | ICD-10-CM | POA: Insufficient documentation

## 2021-06-10 HISTORY — PX: CHOLECYSTECTOMY: SHX55

## 2021-06-10 LAB — BASIC METABOLIC PANEL
Anion gap: 11 (ref 5–15)
BUN: 6 mg/dL (ref 6–20)
CO2: 21 mmol/L — ABNORMAL LOW (ref 22–32)
Calcium: 9.2 mg/dL (ref 8.9–10.3)
Chloride: 106 mmol/L (ref 98–111)
Creatinine, Ser: 0.52 mg/dL (ref 0.44–1.00)
GFR, Estimated: >60 mL/min (ref >60)
Glucose, Bld: 88 mg/dL (ref 70–99)
Potassium: 4 mmol/L (ref 3.5–5.1)
Sodium: 138 mmol/L (ref 135–145)

## 2021-06-10 LAB — CBC
HCT: 45 % (ref 36.0–46.0)
Hemoglobin: 13.5 g/dL (ref 12.0–15.0)
MCH: 26.9 pg (ref 26.0–34.0)
MCHC: 30 g/dL (ref 30.0–36.0)
MCV: 89.6 fL (ref 80.0–100.0)
Platelets: 264 10*3/uL (ref 150–400)
RBC: 5.02 MIL/uL (ref 3.87–5.11)
RDW: 15 % (ref 11.5–15.5)
WBC: 6.9 10*3/uL (ref 4.0–10.5)
nRBC: 0 % (ref 0.0–0.2)

## 2021-06-10 LAB — POCT PREGNANCY, URINE: Preg Test, Ur: NEGATIVE

## 2021-06-10 SURGERY — LAPAROSCOPIC CHOLECYSTECTOMY
Anesthesia: General

## 2021-06-10 MED ORDER — CHLORHEXIDINE GLUCONATE 4 % EX LIQD: 60.0000 mL | Freq: Once | CUTANEOUS | Status: DC

## 2021-06-10 MED ORDER — SODIUM CHLORIDE 0.9% FLUSH: 3.0000 mL | Freq: Two times a day (BID) | INTRAVENOUS | Status: DC

## 2021-06-10 MED ORDER — SODIUM CHLORIDE 0.9% FLUSH: 3.0000 mL | INTRAVENOUS | Status: DC | PRN

## 2021-06-10 MED ORDER — SODIUM CHLORIDE 0.9 % IR SOLN
Status: DC | PRN
  Administered 2021-06-10: 1000 mL

## 2021-06-10 MED ORDER — PROPOFOL 10 MG/ML IV BOLUS
INTRAVENOUS | Status: DC | PRN
  Administered 2021-06-10: 150 mg via INTRAVENOUS

## 2021-06-10 MED ORDER — MIDAZOLAM HCL 2 MG/2ML IJ SOLN
INTRAMUSCULAR | Status: DC | PRN
  Administered 2021-06-10: 2 mg via INTRAVENOUS

## 2021-06-10 MED ORDER — GABAPENTIN 300 MG PO CAPS
300.0000 mg | ORAL_CAPSULE | ORAL | Status: AC
  Administered 2021-06-10: 300 mg via ORAL
  Filled 2021-06-10: qty 1

## 2021-06-10 MED ORDER — ALBUTEROL SULFATE HFA 108 (90 BASE) MCG/ACT IN AERS
INHALATION_SPRAY | RESPIRATORY_TRACT | Status: AC
  Filled 2021-06-10: qty 6.7

## 2021-06-10 MED ORDER — LACTATED RINGERS IV SOLN: INTRAVENOUS | Status: DC

## 2021-06-10 MED ORDER — ACETAMINOPHEN 650 MG RE SUPP: 650.0000 mg | RECTAL | Status: DC | PRN

## 2021-06-10 MED ORDER — FENTANYL CITRATE (PF) 100 MCG/2ML IJ SOLN
INTRAMUSCULAR | Status: AC
  Filled 2021-06-10: qty 2

## 2021-06-10 MED ORDER — CEFAZOLIN SODIUM-DEXTROSE 2-4 GM/100ML-% IV SOLN
2.0000 g | INTRAVENOUS | Status: AC
  Administered 2021-06-10: 2 g via INTRAVENOUS
  Filled 2021-06-10: qty 100

## 2021-06-10 MED ORDER — SODIUM CHLORIDE 0.9 % IV SOLN: 250.0000 mL | INTRAVENOUS | Status: DC | PRN

## 2021-06-10 MED ORDER — DOCUSATE SODIUM 100 MG PO CAPS: 100.0000 mg | ORAL_CAPSULE | Freq: Two times a day (BID) | ORAL | 0 refills | Status: AC

## 2021-06-10 MED ORDER — BUPIVACAINE-EPINEPHRINE (PF) 0.25% -1:200000 IJ SOLN
INTRAMUSCULAR | Status: AC
  Filled 2021-06-10: qty 30

## 2021-06-10 MED ORDER — ROCURONIUM BROMIDE 10 MG/ML (PF) SYRINGE
PREFILLED_SYRINGE | INTRAVENOUS | Status: AC
  Filled 2021-06-10: qty 20

## 2021-06-10 MED ORDER — DEXAMETHASONE SODIUM PHOSPHATE 10 MG/ML IJ SOLN
INTRAMUSCULAR | Status: DC | PRN
  Administered 2021-06-10: 10 mg via INTRAVENOUS

## 2021-06-10 MED ORDER — PHENYLEPHRINE 40 MCG/ML (10ML) SYRINGE FOR IV PUSH (FOR BLOOD PRESSURE SUPPORT)
PREFILLED_SYRINGE | INTRAVENOUS | Status: AC
  Filled 2021-06-10: qty 10

## 2021-06-10 MED ORDER — TRAMADOL HCL 50 MG PO TABS: 50.0000 mg | ORAL_TABLET | Freq: Four times a day (QID) | ORAL | 0 refills | Status: AC | PRN

## 2021-06-10 MED ORDER — FENTANYL CITRATE (PF) 250 MCG/5ML IJ SOLN
INTRAMUSCULAR | Status: AC
  Filled 2021-06-10: qty 5

## 2021-06-10 MED ORDER — PROPOFOL 10 MG/ML IV BOLUS
INTRAVENOUS | Status: AC
  Filled 2021-06-10: qty 20

## 2021-06-10 MED ORDER — OXYCODONE HCL 5 MG PO TABS
ORAL_TABLET | ORAL | Status: AC
  Administered 2021-06-10: 5 mg via ORAL
  Filled 2021-06-10: qty 1

## 2021-06-10 MED ORDER — ACETAMINOPHEN 325 MG PO TABS: 650.0000 mg | ORAL_TABLET | ORAL | Status: DC | PRN

## 2021-06-10 MED ORDER — FENTANYL CITRATE (PF) 100 MCG/2ML IJ SOLN
INTRAMUSCULAR | Status: DC | PRN
  Administered 2021-06-10: 100 ug via INTRAVENOUS
  Administered 2021-06-10: 50 ug via INTRAVENOUS
  Administered 2021-06-10: 100 ug via INTRAVENOUS

## 2021-06-10 MED ORDER — LIDOCAINE 2% (20 MG/ML) 5 ML SYRINGE
INTRAMUSCULAR | Status: AC
  Filled 2021-06-10: qty 15

## 2021-06-10 MED ORDER — PHENYLEPHRINE 40 MCG/ML (10ML) SYRINGE FOR IV PUSH (FOR BLOOD PRESSURE SUPPORT)
PREFILLED_SYRINGE | INTRAVENOUS | Status: DC | PRN
  Administered 2021-06-10: 80 ug via INTRAVENOUS
  Administered 2021-06-10: 120 ug via INTRAVENOUS

## 2021-06-10 MED ORDER — FENTANYL CITRATE (PF) 100 MCG/2ML IJ SOLN: 25.0000 ug | INTRAMUSCULAR | Status: DC | PRN

## 2021-06-10 MED ORDER — ONDANSETRON HCL 4 MG/2ML IJ SOLN
INTRAMUSCULAR | Status: DC | PRN
  Administered 2021-06-10: 4 mg via INTRAVENOUS

## 2021-06-10 MED ORDER — BUPIVACAINE-EPINEPHRINE 0.25% -1:200000 IJ SOLN
INTRAMUSCULAR | Status: DC | PRN
  Administered 2021-06-10: 14 mL

## 2021-06-10 MED ORDER — MIDAZOLAM HCL 2 MG/2ML IJ SOLN
INTRAMUSCULAR | Status: AC
  Filled 2021-06-10: qty 2

## 2021-06-10 MED ORDER — LIDOCAINE 2% (20 MG/ML) 5 ML SYRINGE
INTRAMUSCULAR | Status: DC | PRN
  Administered 2021-06-10: 60 mg via INTRAVENOUS

## 2021-06-10 MED ORDER — ONDANSETRON HCL 4 MG/2ML IJ SOLN
INTRAMUSCULAR | Status: AC
  Filled 2021-06-10: qty 8

## 2021-06-10 MED ORDER — DEXAMETHASONE SODIUM PHOSPHATE 10 MG/ML IJ SOLN
INTRAMUSCULAR | Status: AC
  Filled 2021-06-10: qty 4

## 2021-06-10 MED ORDER — SUGAMMADEX SODIUM 200 MG/2ML IV SOLN
INTRAVENOUS | Status: DC | PRN
  Administered 2021-06-10: 200 mg via INTRAVENOUS

## 2021-06-10 MED ORDER — 0.9 % SODIUM CHLORIDE (POUR BTL) OPTIME
TOPICAL | Status: DC | PRN
  Administered 2021-06-10: 1000 mL

## 2021-06-10 MED ORDER — ROCURONIUM BROMIDE 10 MG/ML (PF) SYRINGE
PREFILLED_SYRINGE | INTRAVENOUS | Status: DC | PRN
  Administered 2021-06-10: 60 mg via INTRAVENOUS

## 2021-06-10 MED ORDER — ROCURONIUM BROMIDE 10 MG/ML (PF) SYRINGE
PREFILLED_SYRINGE | INTRAVENOUS | Status: AC
  Filled 2021-06-10: qty 10

## 2021-06-10 MED ORDER — PROMETHAZINE HCL 25 MG/ML IJ SOLN
INTRAMUSCULAR | Status: AC
  Administered 2021-06-10: 6.25 mg
  Filled 2021-06-10: qty 1

## 2021-06-10 MED ORDER — OXYCODONE HCL 5 MG PO TABS: 5.0000 mg | ORAL_TABLET | ORAL | Status: DC | PRN

## 2021-06-10 MED ORDER — ORAL CARE MOUTH RINSE: 15.0000 mL | Freq: Once | OROMUCOSAL | Status: AC

## 2021-06-10 MED ORDER — ACETAMINOPHEN 500 MG PO TABS
1000.0000 mg | ORAL_TABLET | Freq: Once | ORAL | Status: AC
  Administered 2021-06-10: 1000 mg via ORAL
  Filled 2021-06-10: qty 2

## 2021-06-10 MED ORDER — SODIUM CHLORIDE 0.9 % IV SOLN
6.2500 mg | Freq: Once | INTRAVENOUS | Status: AC
  Administered 2021-06-10: 6.25 mg via INTRAVENOUS

## 2021-06-10 MED ORDER — CHLORHEXIDINE GLUCONATE 0.12 % MT SOLN
15.0000 mL | Freq: Once | OROMUCOSAL | Status: AC
  Administered 2021-06-10: 15 mL via OROMUCOSAL
  Filled 2021-06-10: qty 15

## 2021-06-10 MED ORDER — BUPIVACAINE LIPOSOME 1.3 % IJ SUSP
20.0000 mL | Freq: Once | INTRAMUSCULAR | Status: DC
  Filled 2021-06-10: qty 20

## 2021-06-10 SURGICAL SUPPLY — 39 items
APPLIER CLIP 5 13 M/L LIGAMAX5 (MISCELLANEOUS) ×2
BAG COUNTER SPONGE SURGICOUNT (BAG) IMPLANT
BLADE CLIPPER SURG (BLADE) ×2 IMPLANT
CANISTER SUCT 3000ML PPV (MISCELLANEOUS) ×2 IMPLANT
CHLORAPREP W/TINT 26 (MISCELLANEOUS) ×2 IMPLANT
CLIP APPLIE 5 13 M/L LIGAMAX5 (MISCELLANEOUS) ×1 IMPLANT
CNTNR URN SCR LID CUP LEK RST (MISCELLANEOUS) ×1 IMPLANT
CONT SPEC 4OZ STRL OR WHT (MISCELLANEOUS) ×2
COVER SURGICAL LIGHT HANDLE (MISCELLANEOUS) ×2 IMPLANT
DERMABOND ADVANCED (GAUZE/BANDAGES/DRESSINGS) ×1
DERMABOND ADVANCED .7 DNX12 (GAUZE/BANDAGES/DRESSINGS) ×1 IMPLANT
ELECT REM PT RETURN 9FT ADLT (ELECTROSURGICAL) ×2
ELECTRODE REM PT RTRN 9FT ADLT (ELECTROSURGICAL) ×1 IMPLANT
GAUZE 4X4 16PLY ~~LOC~~+RFID DBL (SPONGE) ×2 IMPLANT
GLOVE SURG ENC MOIS LTX SZ6 (GLOVE) ×2 IMPLANT
GLOVE SURG UNDER LTX SZ6.5 (GLOVE) ×2 IMPLANT
GLOVE SURG UNDER POLY LF SZ6 (GLOVE) ×6 IMPLANT
GOWN STRL REUS W/ TWL LRG LVL3 (GOWN DISPOSABLE) ×3 IMPLANT
GOWN STRL REUS W/TWL LRG LVL3 (GOWN DISPOSABLE) ×6
GRASPER SUT TROCAR 14GX15 (MISCELLANEOUS) ×2 IMPLANT
KIT BASIN OR (CUSTOM PROCEDURE TRAY) ×2 IMPLANT
KIT TURNOVER KIT B (KITS) ×2 IMPLANT
NEEDLE INSUFFLATION 14GA 120MM (NEEDLE) ×2 IMPLANT
NS IRRIG 1000ML POUR BTL (IV SOLUTION) ×2 IMPLANT
PAD ARMBOARD 7.5X6 YLW CONV (MISCELLANEOUS) ×2 IMPLANT
POUCH SPECIMEN RETRIEVAL 10MM (ENDOMECHANICALS) ×2 IMPLANT
SCISSORS LAP 5X35 DISP (ENDOMECHANICALS) ×2 IMPLANT
SET IRRIG TUBING LAPAROSCOPIC (IRRIGATION / IRRIGATOR) ×2 IMPLANT
SET TUBE SMOKE EVAC HIGH FLOW (TUBING) ×2 IMPLANT
SLEEVE ENDOPATH XCEL 5M (ENDOMECHANICALS) ×4 IMPLANT
SPECIMEN JAR SMALL (MISCELLANEOUS) IMPLANT
SPONGE T-LAP 12X12 ~~LOC~~+RFID (SPONGE) ×2 IMPLANT
SUT MNCRL AB 4-0 PS2 18 (SUTURE) ×2 IMPLANT
TOWEL GREEN STERILE (TOWEL DISPOSABLE) IMPLANT
TOWEL GREEN STERILE FF (TOWEL DISPOSABLE) ×2 IMPLANT
TRAY LAPAROSCOPIC MC (CUSTOM PROCEDURE TRAY) ×2 IMPLANT
TROCAR XCEL NON-BLD 11X100MML (ENDOMECHANICALS) ×2 IMPLANT
TROCAR XCEL NON-BLD 5MMX100MML (ENDOMECHANICALS) ×2 IMPLANT
WATER STERILE IRR 1000ML POUR (IV SOLUTION) IMPLANT

## 2021-06-10 NOTE — Interval H&P Note (Signed)
History and Physical Interval Note:  06/10/2021 2:35 PM  Angela Lozano  has presented today for surgery, with the diagnosis of BILIARY COLIC.  The various methods of treatment have been discussed with the patient and family. After consideration of risks, benefits and other options for treatment, the patient has consented to  Procedure(s): LAPAROSCOPIC CHOLECYSTECTOMY (N/A) as a surgical intervention.  The patient's history has been reviewed, patient examined, no change in status, stable for surgery.  I have reviewed the patient's chart and labs.  Questions were answered to the patient's satisfaction.     Paulette Rockford Lollie Sails

## 2021-06-10 NOTE — Transfer of Care (Signed)
Immediate Anesthesia Transfer of Care Note  Patient: Angela Lozano  Procedure(s) Performed: LAPAROSCOPIC CHOLECYSTECTOMY  Patient Location: PACU  Anesthesia Type:General  Level of Consciousness: drowsy and patient cooperative  Airway & Oxygen Therapy: Patient Spontanous Breathing and Patient connected to nasal cannula oxygen  Post-op Assessment: Report given to RN and Post -op Vital signs reviewed and stable  Post vital signs: Reviewed and stable  Last Vitals:  Vitals Value Taken Time  BP    Temp    Pulse 81 06/10/21 1640  Resp 21 06/10/21 1640  SpO2 100 % 06/10/21 1640  Vitals shown include unvalidated device data.  Last Pain:  Vitals:   06/10/21 1332  TempSrc:   PainSc: 0-No pain         Complications: No notable events documented.

## 2021-06-10 NOTE — Op Note (Signed)
Operative Note  Angela Lozano 25 y.o. female [id number]  06/10/2021  Surgeon: Berna Bue MD FACS  Procedure performed: Laparoscopic Cholecystectomy  Preop diagnosis: Biliary colic Post-op diagnosis/intraop findings: same, chronic cholecystitis  Specimens: gallbladder  Retained items: none  EBL: minimal  Complications: none  Description of procedure: After obtaining informed consent the patient was brought to the operating room. Antibiotics were administered. SCD's were applied. General endotracheal anesthesia was initiated and a formal time-out was performed. The abdomen was prepped and draped in the usual sterile fashion and the abdomen was entered using  and infraumbilical Veress needle which would not penetrate the peritoneal surface so this was moved to the left subcostal region and we were able to obtain pneumoperitoneum.  An optical entry right upper quadrant trocar was then inserted and the Veress site inspected and confirmed to be free of any injury to the underlying viscera, as well as the umbilical site where it appeared that the Veress needle had indeed insufflated the preperitoneal space and had not penetrated the peritoneal surface.  Under direct visualization an infraumbilical and right lateral 5 Miller trocar as well as an epigastric 11 mm trocar were placed.  The patient was then placed in reverse Trendelenburg and rotated to the left.  The gallbladder was grasped and retracted cephalad, and the infundibulum was retracted laterally.   A combination of hook electrocautery and blunt dissection was utilized to clear the peritoneum from the neck and cystic duct, circumferentially isolating the cystic artery and cystic duct and lifting the gallbladder from the cystic plate.  The artery was diminutive and coursing over the surface of the cystic duct and this was divided with cautery.  The critical view of safety was achieved with circumferential dissection of the cystic  duct and elevation of the proximal half of the gallbladder off the liver plate and no other structures noted entering the gallbladder.  3 clips were placed on the proximal cystic duct and 1 distally and the cystic duct was divided sharply.  A posterior branch artery was noted coursing along the lateral aspect of the gallbladder and this was clipped with a single clip.  The gallbladder was dissected from the liver plate using electrocautery.  There did appear to be an additional very small band of tissue between the gallbladder and the midportion of the liver bed concerning for a duct of Luschka and so this was clipped proximally before being divided.  Once freed the gallbladder was placed in an endocatch bag and removed through the epigastric trocar site. A small amount of bleeding on the liver bed was controlled with cautery. Some bile had been spilled from the gallbladder during its dissection from the liver bed. This was aspirated and the right upper quadrant was irrigated copiously until the effluent was clear. Hemostasis was once again confirmed, and reinspection of the abdomen revealed no injuries. The clips were well opposed without any bile leak from the duct or the liver bed. The 22mm trocar site in the epigastrium was closed with a 0 vicryl in the fascia under direct visualization using a PMI device. The abdomen was desufflated and all trocars removed. The skin incisions were closed with subcuticular 4-0 monocryl and Dermabond. The patient was awakened, extubated and transported to the recovery room in stable condition.    All counts were correct at the completion of the case.

## 2021-06-10 NOTE — Discharge Instructions (Signed)
LAPAROSCOPIC SURGERY: POST OP INSTRUCTIONS   EAT Gradually transition to a high fiber diet with a fiber supplement over the next few weeks after discharge.  Start with a pureed / full liquid diet (see below)  WALK Walk an hour a day.  Control your pain to do that.    CONTROL PAIN Control pain so that you can walk, sleep, tolerate sneezing/coughing, go up/down stairs.  HAVE A BOWEL MOVEMENT DAILY Keep your bowels regular to avoid problems.  OK to try a laxative to override constipation.  OK to use an antidairrheal to slow down diarrhea.  Call if not better after 2 tries  CALL IF YOU HAVE PROBLEMS/CONCERNS Call if you are still struggling despite following these instructions. Call if you have concerns not answered by these instructions    DIET: Follow a light bland diet & liquids the first 24 hours after arrival home, such as soup, liquids, starches, etc.  Be sure to drink plenty of fluids.  Quickly advance to a usual solid diet within a few days.  Avoid fast food or heavy meals as your are more likely to get nauseated or have irregular bowels.  A low-sugar, high-fiber diet for the rest of your life is ideal.  Take your usually prescribed home medications unless otherwise directed.  PAIN CONTROL: Pain is best controlled by a usual combination of three different methods TOGETHER: Ice/Heat Over the counter pain medication Prescription pain medication Most patients will experience some swelling and bruising around the incisions.  Ice packs or heating pads (30-60 minutes up to 6 times a day) will help. Use ice for the first few days to help decrease swelling and bruising, then switch to heat to help relax tight/sore spots and speed recovery.  Some people prefer to use ice alone, heat alone, alternating between ice & heat.  Experiment to what works for you.  Swelling and bruising can take several weeks to resolve.   It is helpful to take an over-the-counter pain medication regularly for the  first few days: Naproxen (Aleve, etc)  Two 220mg tabs twice a day OR Ibuprofen (Advil, etc) Three 200mg tabs four times a day (every meal & bedtime) AND Acetaminophen (Tylenol, etc) 500-650mg four times a day (every meal & bedtime) A  prescription for pain medication (such as oxycodone, hydrocodone, tramadol, gabapentin, methocarbamol, etc) should be given to you upon discharge.  Take your pain medication as prescribed, IF NEEDED.  If you are having problems/concerns with the prescription medicine (does not control pain, nausea, vomiting, rash, itching, etc), please call us [phone] to see if we need to switch you to a different pain medicine that will work better for you and/or control your side effect better. If you need a refill on your pain medication, please give us 48 hour notice.  contact your pharmacy.  They will contact our office to request authorization. Prescriptions will not be filled after 5 pm or on week-ends  Avoid getting constipated.   Between the surgery and the pain medications, it is common to experience some constipation.   Increasing fluid intake and taking a fiber supplement (such as Metamucil, Citrucel, FiberCon, MiraLax, etc) 1-2 times a day regularly will usually help prevent this problem from occurring.   A mild laxative (prune juice, Milk of Magnesia, MiraLax, etc) should be taken according to package directions if there are no bowel movements after 48 hours.   Watch out for diarrhea.   If you have many loose bowel movements, simplify your diet   to bland foods & liquids for a few days.   Stop any stool softeners and decrease your fiber supplement.   Switching to mild anti-diarrheal medications (Kayopectate, Pepto Bismol) can help.   If this worsens or does not improve, please call us.  Wash / shower every day.  You may shower over the skin glue which is waterproof  Glue will flake off after about 2 weeks.  You may leave the incision open to air.  You may replace a  dressing/Band-Aid to cover the incision for comfort if you wish.   ACTIVITIES as tolerated:   You may resume regular (light) daily activities beginning the next day--such as daily self-care, walking, climbing stairs--gradually increasing activities as tolerated.  If you can walk 30 minutes without difficulty, it is safe to try more intense activity such as jogging, treadmill, bicycling, low-impact aerobics, swimming, etc. Save the most intensive and strenuous activity for last such as sit-ups, heavy lifting, contact sports, etc  Refrain from any heavy lifting or straining until you are off narcotics for pain control.   DO NOT PUSH THROUGH PAIN.  Let pain be your guide: If it hurts to do something, don't do it.  Pain is your body warning you to avoid that activity for another week until the pain goes down. You may drive when you are no longer taking prescription pain medication, you can comfortably wear a seatbelt, and you can safely maneuver your car and apply brakes. You may have sexual intercourse when it is comfortable.  FOLLOW UP in our office Please call [facility] at [phone] to set up an appointment to see your surgeon in the office for a follow-up appointment approximately 2-3 weeks after your surgery. Make sure that you call for this appointment the day you arrive home to insure a convenient appointment time.  10. IF YOU HAVE DISABILITY OR FAMILY LEAVE FORMS, BRING THEM TO THE OFFICE FOR PROCESSING.  DO NOT GIVE THEM TO YOUR DOCTOR.   WHEN TO CALL US [phone]: Poor pain control Reactions / problems with new medications (rash/itching, nausea, etc)  Fever over 101.5 F (38.5 C) Inability to urinate Nausea and/or vomiting Worsening swelling or bruising Continued bleeding from incision. Increased pain, redness, or drainage from the incision   The clinic staff is available to answer your questions during regular business hours (8:30am-5pm).  Please don't hesitate to call and ask to  speak to one of our nurses for clinical concerns.   If you have a medical emergency, go to the nearest emergency room or call 911.  A surgeon from [facility] is always on call at the hospitals   [facility] [address], [address], [place], [place]  [address] ? MAIN: [phone] ? TOLL FREE: [phone] ?  FAX [phone] [email or web address]     

## 2021-06-10 NOTE — Anesthesia Preprocedure Evaluation (Addendum)
Anesthesia Evaluation  Patient identified by MRN, date of birth, ID band Patient awake    Reviewed: Allergy & Precautions, NPO status , Patient's Chart, lab work & pertinent test results  Airway Mallampati: II  TM Distance: >3 FB Neck ROM: Full    Dental no notable dental hx. (+) Teeth Intact, Dental Advisory Given   Pulmonary neg pulmonary ROS,    Pulmonary exam normal breath sounds clear to auscultation       Cardiovascular hypertension, Normal cardiovascular exam Rhythm:Regular Rate:Normal     Neuro/Psych negative neurological ROS  negative psych ROS   GI/Hepatic negative GI ROS, Neg liver ROS,   Endo/Other  Obese BMI 35  Renal/GU negative Renal ROS  negative genitourinary   Musculoskeletal negative musculoskeletal ROS (+)   Abdominal   Peds  Hematology negative hematology ROS (+)   Anesthesia Other Findings   Reproductive/Obstetrics (+) Breast feeding                             Anesthesia Physical Anesthesia Plan  ASA: 2  Anesthesia Plan: General   Post-op Pain Management:    Induction: Intravenous  PONV Risk Score and Plan: 3 and Midazolam, Dexamethasone and Ondansetron  Airway Management Planned: Oral ETT  Additional Equipment:   Intra-op Plan:   Post-operative Plan: Extubation in OR  Informed Consent: I have reviewed the patients History and Physical, chart, labs and discussed the procedure including the risks, benefits and alternatives for the proposed anesthesia with the patient or authorized representative who has indicated his/her understanding and acceptance.     Dental advisory given  Plan Discussed with: CRNA  Anesthesia Plan Comments:         Anesthesia Quick Evaluation

## 2021-06-10 NOTE — Anesthesia Procedure Notes (Signed)
Procedure Name: Intubation Date/Time: 06/10/2021 3:29 PM Performed by: Moshe Salisbury, CRNA Pre-anesthesia Checklist: Patient identified, Emergency Drugs available, Suction available and Patient being monitored Patient Re-evaluated:Patient Re-evaluated prior to induction Oxygen Delivery Method: Circle System Utilized Preoxygenation: Pre-oxygenation with 100% oxygen Induction Type: IV induction Ventilation: Mask ventilation without difficulty Laryngoscope Size: Mac and 3 Grade View: Grade I Tube type: Oral Tube size: 7.0 mm Number of attempts: 1 Airway Equipment and Method: Stylet Placement Confirmation: ETT inserted through vocal cords under direct vision, positive ETCO2 and breath sounds checked- equal and bilateral Secured at: 20 cm Tube secured with: Tape Dental Injury: Teeth and Oropharynx as per pre-operative assessment

## 2021-06-11 NOTE — Anesthesia Postprocedure Evaluation (Signed)
Anesthesia Post Note  Patient: Angela Lozano  Procedure(s) Performed: LAPAROSCOPIC CHOLECYSTECTOMY     Patient location during evaluation: PACU Anesthesia Type: General Level of consciousness: awake Pain management: pain level controlled Vital Signs Assessment: post-procedure vital signs reviewed and stable Respiratory status: spontaneous breathing, nonlabored ventilation, respiratory function stable and patient connected to nasal cannula oxygen Cardiovascular status: blood pressure returned to baseline and stable Postop Assessment: no apparent nausea or vomiting Anesthetic complications: no   No notable events documented.  Last Vitals:  Vitals:   06/10/21 1700 06/10/21 1715  BP: (!) 178/143 (!) 138/91  Pulse: 84 100  Resp: 14 19  Temp:  (!) 36.1 C  SpO2: 100% 100%    Last Pain:  Vitals:   06/10/21 1715  TempSrc:   PainSc: 5                  Shomari Matusik P Rowe Warman

## 2021-06-12 ENCOUNTER — Encounter (HOSPITAL_COMMUNITY): Payer: Self-pay | Admitting: Surgery

## 2021-06-14 LAB — SURGICAL PATHOLOGY

## 2021-09-04 NOTE — L&D Delivery Note (Signed)
[facility]N [facility] Delivery Note  Angela Lozano is a 26 y.o. G2P1001 s/p vag del at [redacted]w[redacted]d. She was admitted for IOL due to gHTN.   ROM: 2h 61m with clear fluid GBS Status: neg Maximum Maternal Temperature: 99.1  Labor Progress: Angela Lozano was sent from the office for induction on 07/12/22 due to subsequent BP elevations. She remained asymptomatic for pre-e and had negative labs. She progressed with a double dose of cytotec, cervical foley, and Pitocin to complete and pushed approx 40 min to vag del.  Delivery Date/Time: November 9th, 2023 at 0543 Delivery: Called to room and patient was complete and pushing. Head delivered LOA. No nuchal cord present. Shoulder and body delivered in usual fashion. Infant with spontaneous cry, placed on mother's abdomen, dried and stimulated. Cord clamped x 2 after 1-minute delay, and cut by FOB. Cord blood drawn. Placenta delivered spontaneously with gentle cord traction. Fundus firm with massage and Pitocin. Labia, perineum, vagina, and cervix inspected and found to be intact.   Placenta: spont, intact; to L&D Complications: none Lacerations: none EBL: 96cc Analgesia: epidural  Postpartum Planning [x]  message to sent to schedule follow-up   Infant: girl  APGARs 9/9  3090g (6lb 13oz)  11/9, CNM  07/13/2022 6:29 AM

## 2021-11-15 ENCOUNTER — Encounter (HOSPITAL_BASED_OUTPATIENT_CLINIC_OR_DEPARTMENT_OTHER): Payer: Self-pay

## 2021-11-16 ENCOUNTER — Encounter (HOSPITAL_BASED_OUTPATIENT_CLINIC_OR_DEPARTMENT_OTHER): Payer: Self-pay

## 2021-11-16 ENCOUNTER — Other Ambulatory Visit: Payer: Self-pay

## 2021-11-16 ENCOUNTER — Ambulatory Visit (INDEPENDENT_AMBULATORY_CARE_PROVIDER_SITE_OTHER): Payer: 59 | Admitting: *Deleted

## 2021-11-16 VITALS — Ht 61.0 in | Wt 196.0 lb

## 2021-11-16 DIAGNOSIS — Z3201 Encounter for pregnancy test, result positive: Secondary | ICD-10-CM | POA: Diagnosis not present

## 2021-11-16 LAB — POCT URINE PREGNANCY: Preg Test, Ur: POSITIVE — AB

## 2021-11-16 NOTE — Progress Notes (Signed)
Pt here for confirmation of pregnancy. Test positive. Pt denies vaginal bleeding or any other problems. Pt provided with new OB appt.  ?

## 2021-12-16 ENCOUNTER — Other Ambulatory Visit: Payer: Self-pay

## 2021-12-16 ENCOUNTER — Encounter (HOSPITAL_COMMUNITY): Payer: Self-pay | Admitting: Obstetrics and Gynecology

## 2021-12-16 ENCOUNTER — Inpatient Hospital Stay (HOSPITAL_COMMUNITY): Payer: 59

## 2021-12-16 ENCOUNTER — Inpatient Hospital Stay (HOSPITAL_COMMUNITY)
Admission: AD | Admit: 2021-12-16 | Discharge: 2021-12-16 | Disposition: A | Discharge status: Home / Self Care | Payer: 59 | Attending: Obstetrics and Gynecology | Admitting: Obstetrics and Gynecology

## 2021-12-16 DIAGNOSIS — O26891 Other specified pregnancy related conditions, first trimester: Secondary | ICD-10-CM | POA: Insufficient documentation

## 2021-12-16 DIAGNOSIS — Z3A08 8 weeks gestation of pregnancy: Secondary | ICD-10-CM | POA: Diagnosis not present

## 2021-12-16 DIAGNOSIS — Z6790 Unspecified blood type, Rh positive: Secondary | ICD-10-CM | POA: Insufficient documentation

## 2021-12-16 DIAGNOSIS — M549 Dorsalgia, unspecified: Secondary | ICD-10-CM | POA: Diagnosis not present

## 2021-12-16 DIAGNOSIS — O209 Hemorrhage in early pregnancy, unspecified: Secondary | ICD-10-CM | POA: Insufficient documentation

## 2021-12-16 DIAGNOSIS — Z3491 Encounter for supervision of normal pregnancy, unspecified, first trimester: Secondary | ICD-10-CM

## 2021-12-16 LAB — URINALYSIS, ROUTINE W REFLEX MICROSCOPIC
Bilirubin Urine: NEGATIVE
Glucose, UA: NEGATIVE mg/dL
Hgb urine dipstick: NEGATIVE
Ketones, ur: NEGATIVE mg/dL
Nitrite: NEGATIVE
Protein, ur: NEGATIVE mg/dL
Specific Gravity, Urine: 1.01 (ref 1.005–1.030)
pH: 5 (ref 5.0–8.0)

## 2021-12-16 LAB — CBC
HCT: 37 % (ref 36.0–46.0)
Hemoglobin: 12.3 g/dL (ref 12.0–15.0)
MCH: 27 pg (ref 26.0–34.0)
MCHC: 33.2 g/dL (ref 30.0–36.0)
MCV: 81.1 fL (ref 80.0–100.0)
Platelets: 314 10*3/uL (ref 150–400)
RBC: 4.56 MIL/uL (ref 3.87–5.11)
RDW: 14.8 % (ref 11.5–15.5)
WBC: 9.5 10*3/uL (ref 4.0–10.5)
nRBC: 0 % (ref 0.0–0.2)

## 2021-12-16 LAB — WET PREP, GENITAL
Sperm: NONE SEEN
Trich, Wet Prep: NONE SEEN
WBC, Wet Prep HPF POC: >=10 — AB (ref <10)
Yeast Wet Prep HPF POC: NONE SEEN

## 2021-12-16 LAB — HCG, QUANTITATIVE, PREGNANCY: hCG, Beta Chain, Quant, S: 88640 m[IU]/mL — ABNORMAL HIGH (ref <5)

## 2021-12-16 NOTE — MAU Provider Note (Signed)
?History  ?  ? ?CSN: [id number] ? ?Arrival date and time: 12/16/21 1703 ? ? Event Date/Time  ? First Provider Initiated Contact with Patient 12/16/21 1933   ?  ? ?Chief Complaint  ?Patient presents with  ? Back Pain  ? Vaginal Bleeding  ? ?26 year old G2 P1-0-0-1 at 9.0 weeks by LMP presenting with spotting.  Reports onset today.  She saw some pink spots when she wiped.  Last intercourse was yesterday.  Denies abdominal pain or cramping.  Reports low back pain.  ? ?OB History   ? ? Gravida  ?2  ? Para  ?1  ? Term  ?1  ? Preterm  ?   ? AB  ?   ? Living  ?1  ?  ? ? SAB  ?   ? IAB  ?   ? Ectopic  ?   ? Multiple  ?0  ? Live Births  ?1  ?   ?  ?  ? ? ?Past Medical History:  ?Diagnosis Date  ? Gallstone   ? Medical history non-contributory   ? Preeclampsia   ? ? ?Past Surgical History:  ?Procedure Laterality Date  ? CHOLECYSTECTOMY N/A 06/10/2021  ? Procedure: LAPAROSCOPIC CHOLECYSTECTOMY;  Surgeon: Berna Bue, MD;  Location: [facility] OR;  Service: General;  Laterality: N/A;  ? NO PAST SURGERIES    ? ? ?Family History  ?Problem Relation Age of Onset  ? Miscarriages / [place] Mother   ? ? ?Social History  ? ?Tobacco Use  ? Smoking status: Never  ? Smokeless tobacco: Never  ?Vaping Use  ? Vaping Use: Never used  ?Substance Use Topics  ? Alcohol use: Not Currently  ?  Alcohol/week: 2.0 standard drinks  ?  Types: 2 Cans of beer per week  ? Drug use: Never  ? ? ?Allergies: No Known Allergies ? ?Medications Prior to Admission  ?Medication Sig Dispense Refill Last Dose  ? OVER THE COUNTER MEDICATION Take 1 tablet by mouth daily. Post-prenatal vitamins     ? Vitamin D, Ergocalciferol, (DRISDOL) 1.25 MG (50000 UNIT) CAPS capsule Take 50,000 Units by mouth once a week.     ? ? ?Review of Systems  ?Gastrointestinal:  Negative for abdominal pain.  ?Genitourinary:  Positive for vaginal bleeding.  ?Musculoskeletal:  Positive for back pain.  ?Physical Exam  ? ?Blood pressure 139/81, pulse (!) 105, temperature 99.4 ?F (37.4 ?C), resp.  rate 17, height 5\' 1"  (1.549 m), weight 89.4 kg, last menstrual period 10/14/2021, SpO2 100 %, unknown if currently breastfeeding. ? ?Physical Exam ?Vitals and nursing note reviewed.  ?Constitutional:   ?   Appearance: Normal appearance.  ?HENT:  ?   Head: Normocephalic and atraumatic.  ?Pulmonary:  ?   Effort: Pulmonary effort is normal. No respiratory distress.  ?Musculoskeletal:     ?   General: Normal range of motion.  ?   Cervical back: Normal range of motion.  ?Neurological:  ?   General: No focal deficit present.  ?   Mental Status: She is alert and oriented to person, place, and time.  ?Psychiatric:     ?   Mood and Affect: Mood normal.     ?   Behavior: Behavior normal.  ? ?Results for orders placed or performed during the hospital encounter of 12/16/21 (from the past 24 hour(s))  ?Urinalysis, Routine w reflex microscopic Urine, Clean Catch     Status: Abnormal  ? Collection Time: 12/16/21  5:46 PM  ?Result Value Ref Range  ?  Color, Urine YELLOW YELLOW  ? APPearance HAZY (A) CLEAR  ? Specific Gravity, Urine 1.010 1.005 - 1.030  ? pH 5.0 5.0 - 8.0  ? Glucose, UA NEGATIVE NEGATIVE mg/dL  ? Hgb urine dipstick NEGATIVE NEGATIVE  ? Bilirubin Urine NEGATIVE NEGATIVE  ? Ketones, ur NEGATIVE NEGATIVE mg/dL  ? Protein, ur NEGATIVE NEGATIVE mg/dL  ? Nitrite NEGATIVE NEGATIVE  ? Leukocytes,Ua SMALL (A) NEGATIVE  ? RBC / HPF 0-5 0 - 5 RBC/hpf  ? WBC, UA 6-10 0 - 5 WBC/hpf  ? Bacteria, UA RARE (A) NONE SEEN  ? Squamous Epithelial / LPF 0-5 0 - 5  ? Mucus PRESENT   ?CBC     Status: None  ? Collection Time: 12/16/21  6:27 PM  ?Result Value Ref Range  ? WBC 9.5 4.0 - 10.5 K/uL  ? RBC 4.56 3.87 - 5.11 MIL/uL  ? Hemoglobin 12.3 12.0 - 15.0 g/dL  ? HCT 37.0 36.0 - 46.0 %  ? MCV 81.1 80.0 - 100.0 fL  ? MCH 27.0 26.0 - 34.0 pg  ? MCHC 33.2 30.0 - 36.0 g/dL  ? RDW 14.8 11.5 - 15.5 %  ? Platelets 314 150 - 400 K/uL  ? nRBC 0.0 0.0 - 0.2 %  ?Wet prep, genital     Status: Abnormal  ? Collection Time: 12/16/21  6:30 PM  ?Result  Value Ref Range  ? Yeast Wet Prep HPF POC NONE SEEN NONE SEEN  ? Trich, Wet Prep NONE SEEN NONE SEEN  ? Clue Cells Wet Prep HPF POC PRESENT (A) NONE SEEN  ? WBC, Wet Prep HPF POC >=10 (A) <10  ? Sperm NONE SEEN   ? ?US OB LESS THAN 14 WEEKS WITH OB TRANSVAGINAL ? ?Result Date: 12/16/2021 ?CLINICAL DATA:  Initial evaluation for vaginal bleeding, early pregnancy. EXAM: OBSTETRIC <14 WK ULTRASOUND TECHNIQUE: Transabdominal ultrasound was performed for evaluation of the gestation as well as the maternal uterus and adnexal regions. COMPARISON:  None. FINDINGS: Intrauterine gestational sac: Signal Yolk sac:  Present Embryo:  Present Cardiac Activity: Present Heart Rate: 166 bpm CRL:   16.3 mm   8 w 0 d                  [place] EDC: 07/28/2022 Subchorionic hemorrhage:  None visualized. Maternal uterus/adnexae: Ovaries within normal limits bilaterally. No adnexal mass or free fluid. IMPRESSION: 1. Single viable IUP, estimated gestational age [redacted] weeks and 0 day by crown-rump length, with ultrasound EDC of 07/28/2022. No complication. 2. No other acute maternal uterine or adnexal abnormality. Electronically Signed   By: Rise Mu M.D.   On: 12/16/2021 19:20   ? ?MAU Course  ?Procedures ? ?MDM ?Labs and ultrasound ordered and reviewed.  Viable IUP at 8 weeks 0 days, no SCH, will change EDC.  Discussed findings with patient and partner.  Stable for discharge home. ? ?Assessment and Plan  ? ?1. [redacted] weeks gestation of pregnancy   ?2. Normal intrauterine pregnancy on prenatal ultrasound in first trimester   ?3. Blood type, Rh positive   ? ?Discharge home ?Follow-up at [facility] as scheduled in 2 weeks ?SAB precautions ?Start PNV ? ?Allergies as of 12/16/2021   ?No Known Allergies ?  ? ?  ?Medication List  ?  ? ?STOP taking these medications   ? ?OVER THE COUNTER MEDICATION ?  ?Vitamin D (Ergocalciferol) 1.25 MG (50000 UNIT) Caps capsule ?Commonly known as: DRISDOL ?  ? ?  ? ? ?Donette Larry, CNM ?12/16/2021, 7:39 PM  ?

## 2021-12-16 NOTE — MAU Note (Signed)
Zarah Carbon is a 26 y.o. at [redacted]w[redacted]d here in MAU reporting: is spotting, it is really really light. Having a little bit of back pain. ? ?Onset of complaint: 1500 ?Pain score: mild ?Vitals:  ? 12/16/21 1726  ?BP: 139/81  ?Pulse: (!) 105  ?Resp: 17  ?Temp: 99.4 ?F (37.4 ?C)  ?SpO2: 100%  ?   ? ?Lab orders placed from triage:  urine ?

## 2021-12-16 NOTE — Discharge Instructions (Signed)

## 2021-12-16 NOTE — Progress Notes (Signed)
Written and verbal d/c instructions given and understanding voiced. 

## 2021-12-16 NOTE — Progress Notes (Signed)
Donette Larry CNM in earlier to discuss test results and d/c plan. Written and verbal dc instructions given and understanding vocied. ?

## 2021-12-19 LAB — GC/CHLAMYDIA PROBE AMP (~~LOC~~) NOT AT ARMC
Chlamydia: NEGATIVE
Comment: NEGATIVE
Comment: NORMAL
Neisseria Gonorrhea: NEGATIVE

## 2021-12-29 ENCOUNTER — Encounter (HOSPITAL_BASED_OUTPATIENT_CLINIC_OR_DEPARTMENT_OTHER): Payer: Self-pay

## 2021-12-29 ENCOUNTER — Ambulatory Visit (INDEPENDENT_AMBULATORY_CARE_PROVIDER_SITE_OTHER): Payer: 59 | Admitting: Obstetrics & Gynecology

## 2021-12-29 ENCOUNTER — Other Ambulatory Visit (HOSPITAL_COMMUNITY)
Admission: RE | Admit: 2021-12-29 | Discharge: 2021-12-29 | Disposition: A | Discharge status: Home / Self Care | Payer: 59 | Source: Ambulatory Visit | Attending: Obstetrics & Gynecology | Admitting: Obstetrics & Gynecology

## 2021-12-29 ENCOUNTER — Ambulatory Visit (INDEPENDENT_AMBULATORY_CARE_PROVIDER_SITE_OTHER): Payer: 59 | Admitting: *Deleted

## 2021-12-29 VITALS — BP 125/65 | HR 94 | Wt 196.8 lb

## 2021-12-29 DIAGNOSIS — Z3A09 9 weeks gestation of pregnancy: Secondary | ICD-10-CM

## 2021-12-29 DIAGNOSIS — O0991 Supervision of high risk pregnancy, unspecified, first trimester: Secondary | ICD-10-CM

## 2021-12-29 DIAGNOSIS — Z8759 Personal history of other complications of pregnancy, childbirth and the puerperium: Secondary | ICD-10-CM | POA: Insufficient documentation

## 2021-12-29 DIAGNOSIS — O139 Gestational [pregnancy-induced] hypertension without significant proteinuria, unspecified trimester: Secondary | ICD-10-CM | POA: Insufficient documentation

## 2021-12-29 DIAGNOSIS — O0990 Supervision of high risk pregnancy, unspecified, unspecified trimester: Secondary | ICD-10-CM | POA: Insufficient documentation

## 2021-12-29 DIAGNOSIS — Z6837 Body mass index (BMI) 37.0-37.9, adult: Secondary | ICD-10-CM

## 2021-12-29 LAB — HEPATITIS C ANTIBODY: HCV Ab: NEGATIVE

## 2021-12-29 NOTE — Progress Notes (Signed)
New OB Intake ? ?I explained I am completing New OB Intake today. We discussed her EDD of 07/28/22 that is based on ED [place] of 8+0 on 12/16/21. Pt is G2/P1. I reviewed her allergies, medications, Medical/Surgical/OB history, and appropriate screenings. Based on history, this is an uncomplicated pregnancy  ? ?Patient Active Problem List  ? Diagnosis Date Noted  ? Supervision of high risk pregnancy, antepartum 12/29/2021  ? History of pre-eclampsia 12/29/2021  ? BMI 37.0-37.9, adult 12/29/2021  ? ? ?Concerns addressed today ? ?Delivery Plans:  ?Plans to deliver at [facility].  ? ?MyChart/Babyscripts ?MyChart access verified. I explained pt will have some visits in office and some virtually. Babyscripts instructions given and order placed. Patient verifies receipt of registration text/e-mail. Account successfully created and app downloaded. ? ?Blood Pressure Cuff  ?Patient has private insurance; instructed to purchase blood pressure cuff and bring to first prenatal appt. Explained after first prenatal appt pt will check weekly and document in Babyscripts. ? ?Anatomy US ?Explained first scheduled [place] will be around 19 weeks. Anatomy US scheduled for 6/30 at 0945. Pt notified to arrive at 0930. ? ?Labs ?Discussed Avelina Laine genetic screening with patient. Pt considering both Panorama and Horizon drawn at new OB visit. Also if interested in genetic testing, tell patient she will need AFP 15-21 weeks to complete genetic testing .Routine prenatal labs needed. ? ?Covid Vaccine ?Patient has covid vaccine x 1 dose ? ?Informed patient of [facility] website  and placed link in her OB booklet. ? ?Social Determinants of Health ?Food Insecurity: Patient denies food insecurity. ?Transportation: Patient denies transportation needs. ?Childcare: Discussed no children allowed at ultrasound appointments.  ? ? ?Placed OB Box on problem list and updated ? ? ?Harrie Jeans, RN ?12/29/2021  11:11 AM  ?

## 2021-12-29 NOTE — Progress Notes (Addendum)
? ?History:  ? Angela Lozano is a 26 y.o. G2P1001 at [redacted]w[redacted]d by early ultrasound being seen today for her first obstetrical visit.  Her obstetrical history is significant for obesity. Patient does intend to breast feed and bottle feed. Pregnancy history fully reviewed. ? ?Patient reports  some nausea.  Denies vaginal bleeding . ?  ? ?  ?HISTORY: ?OB History  ?Gravida Para Term Preterm AB Living  ?2 1 1  0 0 1  ?SAB IAB Ectopic Multiple Live Births  ?0 0 0 0 1  ?  ?# Outcome Date GA Lbr Len/2nd Weight Sex Delivery Anes PTL Lv  ?2 Current           ?1 Term 03/18/21 [redacted]w[redacted]d 07:24 / 00:58 5 lb 13.7 oz (2.656 kg) M Vag-Spont EPI  LIV  ?   Name: DEBROAH, SHUTTLEWORTH  ?   Apgar1: 9  Apgar5: 9  ?  ?Last pap smear was done 07/08/19 and was normal ? ?Past Medical History:  ?Diagnosis Date  ? Gallstone   ? Preeclampsia   ? ?Past Surgical History:  ?Procedure Laterality Date  ? CHOLECYSTECTOMY N/A 06/10/2021  ? Procedure: LAPAROSCOPIC CHOLECYSTECTOMY;  Surgeon: 08/10/2021, MD;  Location: [facility] OR;  Service: General;  Laterality: N/A;  ? ?Family History  ?Problem Relation Age of Onset  ? Miscarriages / [facility] Mother   ? Hypertension Father   ? Hyperlipidemia Father   ? Diabetes Maternal Grandmother   ? Hearing loss Maternal Grandfather   ? ?Social History  ? ?Tobacco Use  ? Smoking status: Never  ? Smokeless tobacco: Never  ?Vaping Use  ? Vaping Use: Never used  ?Substance Use Topics  ? Alcohol use: Not Currently  ?  Alcohol/week: 2.0 standard drinks  ?  Types: 2 Cans of beer per week  ? Drug use: Never  ? ?No Known Allergies ?No current outpatient medications on file prior to visit.  ? ?No current facility-administered medications on file prior to visit.  ? ? ?Review of Systems ?Pertinent items noted in HPI and remainder of comprehensive ROS otherwise negative. ? ?Physical Exam:  ? ?Vitals:  ? 12/29/21 0958  ?BP: 125/65  ?Pulse: 94  ?Weight: 196 lb 12.8 oz (89.3 kg)  ? ?Fetal Heart Rate (bpm):  166 ?Bedside Ultrasound for FHR check: Viable intrauterine pregnancy with positive cardiac activity noted, fetal heart rate bpm ?Patient informed that the ultrasound is considered a limited obstetric ultrasound and is not intended to be a complete ultrasound exam.  Patient also informed that the ultrasound is not being completed with the intent of assessing for fetal or placental anomalies or any pelvic abnormalities.  Explained that the purpose of today?s ultrasound is to assess for fetal heart rate.  Patient acknowledges the purpose of the exam and the limitations of the study. ?General: well-developed, well-nourished female in no acute distress  ?Breasts:  normal appearance, no masses or tenderness bilaterally  ?Skin: normal coloration and turgor, no rashes  ?Neurologic: oriented, normal, negative, normal mood  ?Extremities: normal strength, tone, and muscle mass, ROM of all joints is normal  ?HEENT PERRLA, extraocular movement intact and sclera clear, anicteric  ?Neck supple and no masses  ?Cardiovascular: regular rate and rhythm  ?Respiratory:  no respiratory distress, normal breath sounds  ?Abdomen: soft, non-tender; bowel sounds normal; no masses,  no organomegaly  ?Pelvic: Exam deferred  ?  ?Assessment:  ?  ?Pregnancy: G2P1001 ?Patient Active Problem List  ? Diagnosis Date Noted  ? Supervision of high risk pregnancy,  antepartum 12/29/2021  ? History of pre-eclampsia 12/29/2021  ? BMI 37.0-37.9, adult 12/29/2021  ? ?  ?Plan:  ?  ?1. [redacted] weeks gestation of pregnancy ?- ABO/Rh ?- Antibody screen ?- CBC ?- Hepatitis B surface antigen ?- HIV Antibody (routine testing w rflx) ?- HIV (Save tube for possible reflex) ?- RPR ?- Rubella screen ?- Hepatitis C antibody ?- Urine Culture ?- Cervicovaginal ancillary only( [place]) ?- Hemoglobin A1c ?- Comprehensive metabolic panel ?- Protein / creatinine ratio, urine ? ?2. Supervision of high risk pregnancy in first trimester ?- [place] MFM OB DETAIL +14 WK; Future ? ?3.  History of pre-eclampsia ?- CBC, CMP, protein/creatinine ratio for baseline all obtained today ?- pt advised to start baby ASA after 13 weeks ? ?4. BMI 37.0-37.9, adult ?- hbA1C obtained today  ? ?Initial labs drawn. ?Continue prenatal vitamins. ?Problem list reviewed and updated. ?Genetic Screening discussed, NIPS: undecided. ?Ultrasound discussed; fetal anatomic survey: ordered. ?Anticipatory guidance about prenatal visits given including labs, ultrasounds, and testing. ?Discussed usage of Babyscripts and virtual visits as additional source of managing and completing prenatal visits in midst of coronavirus and pandemic.   ?Encouraged to complete MyChart Registration for her ability to review results, send requests, and have questions addressed.  ?The nature of [facility] with multiple MDs and Advanced Practice Providers was explained to patient; also emphasized that residents, students are part of our team. ?Routine obstetric precautions reviewed. Encouraged to seek out care at office or emergency room [facility] preferred) for urgent and/or emergent concerns. ?Return in about 4 weeks (around 01/26/2022).  ?  ? ?M. Leda Quail, MD, FACOG ?Obstetrician Heritage manager, Faculty Practice ?Center for [employer] ?  ?

## 2021-12-30 LAB — COMPREHENSIVE METABOLIC PANEL
ALT: 11 IU/L (ref 0–32)
AST: 8 IU/L (ref 0–40)
Albumin/Globulin Ratio: 1.8 (ref 1.2–2.2)
Albumin: 4.4 g/dL (ref 3.9–5.0)
Alkaline Phosphatase: 52 IU/L (ref 44–121)
BUN/Creatinine Ratio: 10 (ref 9–23)
BUN: 4 mg/dL — ABNORMAL LOW (ref 6–20)
Bilirubin Total: 0.3 mg/dL (ref 0.0–1.2)
CO2: 20 mmol/L (ref 20–29)
Calcium: 9.3 mg/dL (ref 8.7–10.2)
Chloride: 102 mmol/L (ref 96–106)
Creatinine, Ser: 0.41 mg/dL — ABNORMAL LOW (ref 0.57–1.00)
Globulin, Total: 2.5 g/dL (ref 1.5–4.5)
Glucose: 90 mg/dL (ref 70–99)
Potassium: 4.1 mmol/L (ref 3.5–5.2)
Sodium: 137 mmol/L (ref 134–144)
Total Protein: 6.9 g/dL (ref 6.0–8.5)
eGFR: 141 mL/min/{1.73_m2} (ref >59)

## 2021-12-30 LAB — RUBELLA SCREEN: Rubella Antibodies, IGG: 1.09 index (ref >0.99)

## 2021-12-30 LAB — CERVICOVAGINAL ANCILLARY ONLY
Chlamydia: NEGATIVE
Comment: NEGATIVE
Comment: NORMAL
Neisseria Gonorrhea: NEGATIVE

## 2021-12-30 LAB — CBC
Hematocrit: 37.3 % (ref 34.0–46.6)
Hemoglobin: 12.6 g/dL (ref 11.1–15.9)
MCH: 27 pg (ref 26.6–33.0)
MCHC: 33.8 g/dL (ref 31.5–35.7)
MCV: 80 fL (ref 79–97)
Platelets: 295 10*3/uL (ref 150–450)
RBC: 4.66 x10E6/uL (ref 3.77–5.28)
RDW: 14.8 % (ref 11.7–15.4)
WBC: 7.7 10*3/uL (ref 3.4–10.8)

## 2021-12-30 LAB — ABO/RH: Rh Factor: POSITIVE

## 2021-12-30 LAB — HEMOGLOBIN A1C
Est. average glucose Bld gHb Est-mCnc: 111 mg/dL
Hgb A1c MFr Bld: 5.5 % (ref 4.8–5.6)

## 2021-12-30 LAB — PROTEIN / CREATININE RATIO, URINE
Creatinine, Urine: 232.4 mg/dL
Protein, Ur: 20.4 mg/dL
Protein/Creat Ratio: 88 mg/g creat (ref 0–200)

## 2021-12-30 LAB — HIV ANTIBODY (ROUTINE TESTING W REFLEX): HIV Screen 4th Generation wRfx: NONREACTIVE

## 2021-12-30 LAB — HEPATITIS C ANTIBODY: Hep C Virus Ab: NONREACTIVE

## 2021-12-30 LAB — RPR: RPR Ser Ql: NONREACTIVE

## 2021-12-30 LAB — ANTIBODY SCREEN: Antibody Screen: NEGATIVE

## 2021-12-30 LAB — HEPATITIS B SURFACE ANTIGEN: Hepatitis B Surface Ag: NEGATIVE

## 2021-12-31 LAB — URINE CULTURE

## 2022-01-17 ENCOUNTER — Ambulatory Visit (INDEPENDENT_AMBULATORY_CARE_PROVIDER_SITE_OTHER): Payer: 59 | Admitting: Advanced Practice Midwife

## 2022-01-17 VITALS — BP 116/60 | HR 84 | Wt 197.2 lb

## 2022-01-17 DIAGNOSIS — Z3A12 12 weeks gestation of pregnancy: Secondary | ICD-10-CM

## 2022-01-17 DIAGNOSIS — O99210 Obesity complicating pregnancy, unspecified trimester: Secondary | ICD-10-CM | POA: Insufficient documentation

## 2022-01-17 DIAGNOSIS — O99211 Obesity complicating pregnancy, first trimester: Secondary | ICD-10-CM

## 2022-01-17 DIAGNOSIS — O0991 Supervision of high risk pregnancy, unspecified, first trimester: Secondary | ICD-10-CM

## 2022-01-17 MED ORDER — ASPIRIN EC 81 MG PO TBEC: 81.0000 mg | DELAYED_RELEASE_TABLET | Freq: Every day | ORAL | 6 refills | Status: DC

## 2022-01-17 NOTE — Progress Notes (Signed)
? ?  PRENATAL VISIT NOTE ? ?Subjective:  ?Angela Lozano is a 26 y.o. G2P1001 at [redacted]w[redacted]d being seen today for ongoing prenatal care.  She is currently monitored for the following issues for this high-risk pregnancy and has Supervision of high risk pregnancy, antepartum; History of pre-eclampsia; BMI 37.0-37.9, adult; and Obesity affecting pregnancy on their problem list. ? ?Patient reports nausea.  Contractions: Not present. Vag. Bleeding: None.   . Denies leaking of fluid.  ? ?The following portions of the patient's history were reviewed and updated as appropriate: allergies, current medications, past family history, past medical history, past social history, past surgical history and problem list.  ? ?Objective:  ? ?Vitals:  ? 01/17/22 1100  ?BP: 116/60  ?Pulse: 84  ?Weight: 197 lb 3.2 oz (89.4 kg)  ? ? ?Fetal Status: Fetal Heart Rate (bpm): 153        ? ?General:  Alert, oriented and cooperative. Patient is in no acute distress.  ?Skin: Skin is warm and dry. No rash noted.   ?Cardiovascular: Normal heart rate noted  ?Respiratory: Normal respiratory effort, no problems with respiration noted  ?Abdomen: Soft, gravid, appropriate for gestational age.  Pain/Pressure: Absent     ?Pelvic: Cervical exam deferred        ?Extremities: Normal range of motion.  Edema: None  ?Mental Status: Normal mood and affect. Normal behavior. Normal judgment and thought content.  ? ?Assessment and Plan:  ?Pregnancy: G2P1001 at [redacted]w[redacted]d ?1. Obesity affecting pregnancy, antepartum ?--Start Baby ASA now ?- aspirin EC 81 MG tablet; Take 1 tablet (81 mg total) by mouth daily. Swallow whole.  Dispense: 30 tablet; Refill: 6 ? ?2. Supervision of high risk pregnancy in first trimester ?--Anticipatory guidance about next visits/weeks of pregnancy given.  ? ?3. [redacted] weeks gestation of pregnancy ? ? ?Preterm labor symptoms and general obstetric precautions including but not limited to vaginal bleeding, contractions, leaking of fluid and fetal  movement were reviewed in detail with the patient. ?Please refer to After Visit Summary for other counseling recommendations.  ? ?Return for As scheduled. ? ?Future Appointments  ?Date Time Provider [place]  ?02/14/2022 10:30 AM Leftwich-Kirby, Kathie Dike, CNM [facility]  ?03/03/2022  9:30 AM [facility] NURSE [facility]  ?03/03/2022  9:45 AM [facility] US4 [facility]  ?03/14/2022 10:15 AM Leftwich-Kirby, Kathie Dike, CNM [facility]  ?05/05/2022 10:15 AM Megan Salon, MD [facility]  ?05/19/2022 11:00 AM Megan Salon, MD [facility]  ?06/02/2022 11:45 AM Megan Salon, MD [facility]  ?06/16/2022  9:45 AM Megan Salon, MD [facility]  ?06/30/2022  9:30 AM Megan Salon, MD [facility]  ?07/07/2022 11:00 AM Megan Salon, MD [facility]  ?07/14/2022 10:15 AM Megan Salon, MD [facility]  ?07/21/2022 10:15 AM Megan Salon, MD [facility]  ?07/31/2022 10:45 AM Megan Salon, MD [facility]  ? ? ?Fatima Blank, CNM  ?

## 2022-01-23 ENCOUNTER — Encounter (HOSPITAL_BASED_OUTPATIENT_CLINIC_OR_DEPARTMENT_OTHER): Payer: Self-pay | Admitting: Advanced Practice Midwife

## 2022-02-06 ENCOUNTER — Encounter (HOSPITAL_BASED_OUTPATIENT_CLINIC_OR_DEPARTMENT_OTHER): Payer: Self-pay | Admitting: *Deleted

## 2022-02-14 ENCOUNTER — Ambulatory Visit (INDEPENDENT_AMBULATORY_CARE_PROVIDER_SITE_OTHER): Payer: 59 | Admitting: Advanced Practice Midwife

## 2022-02-14 ENCOUNTER — Encounter (HOSPITAL_BASED_OUTPATIENT_CLINIC_OR_DEPARTMENT_OTHER): Payer: 59 | Admitting: Advanced Practice Midwife

## 2022-02-14 VITALS — BP 123/75 | HR 75 | Wt 198.0 lb

## 2022-02-14 DIAGNOSIS — Z8759 Personal history of other complications of pregnancy, childbirth and the puerperium: Secondary | ICD-10-CM

## 2022-02-14 DIAGNOSIS — O0992 Supervision of high risk pregnancy, unspecified, second trimester: Secondary | ICD-10-CM

## 2022-02-14 DIAGNOSIS — Z3A16 16 weeks gestation of pregnancy: Secondary | ICD-10-CM

## 2022-02-14 DIAGNOSIS — Z6837 Body mass index (BMI) 37.0-37.9, adult: Secondary | ICD-10-CM

## 2022-02-14 NOTE — Progress Notes (Signed)
   PRENATAL VISIT NOTE  Subjective:  Angela Lozano is a 26 y.o. G2P1001 at [redacted]w[redacted]d being seen today for ongoing prenatal care.  She is currently monitored for the following issues for this high-risk pregnancy and has Supervision of high risk pregnancy, antepartum; History of pre-eclampsia; BMI 37.0-37.9, adult; and Obesity affecting pregnancy on their problem list.  Patient reports no complaints.  Contractions: Not present. Vag. Bleeding: None.  Movement: Absent. Denies leaking of fluid.   The following portions of the patient's history were reviewed and updated as appropriate: allergies, current medications, past family history, past medical history, past social history, past surgical history and problem list. Problem list updated.  Objective:   Vitals:   02/14/22 1033  BP: 123/75  Pulse: 75  Weight: 198 lb (89.8 kg)    Fetal Status: Fetal Heart Rate (bpm): 145   Movement: Absent     General:  Alert, oriented and cooperative. Patient is in no acute distress.  Skin: Skin is warm and dry. No rash noted.   Cardiovascular: Normal heart rate noted  Respiratory: Normal respiratory effort, no problems with respiration noted  Abdomen: Soft, gravid, appropriate for gestational age.  Pain/Pressure: Absent     Pelvic: Cervical exam deferred        Extremities: Normal range of motion.  Edema: None  Mental Status: Normal mood and affect. Normal behavior. Normal judgment and thought content.   Assessment and Plan:  Pregnancy: G2P1001 at [redacted]w[redacted]d  1. Supervision of high risk pregnancy in second trimester - Routine care, confirmed date and location of anatomy scan  2. History of pre-eclampsia - normotensive in current pregnancy - started bASA 81 g daily about two weeks ago  3. BMI 37.0-37.9, adult - Weight stable  4. [redacted] weeks gestation of pregnancy   Preterm labor symptoms and general obstetric precautions including but not limited to vaginal bleeding, contractions, leaking of  fluid and fetal movement were reviewed in detail with the patient. Please refer to After Visit Summary for other counseling recommendations.  Return in about 4 weeks (around 03/14/2022) for MD or APP.  Future Appointments  Date Time Provider [place]  03/03/2022  9:30 AM [facility] NURSE [facility]  03/03/2022  9:45 AM [facility] US4 [facility]  03/14/2022 10:15 AM Leftwich-Kirby, Kathie Dike, CNM [facility]  05/05/2022 10:15 AM Megan Salon, MD [facility]  05/19/2022 11:00 AM Megan Salon, MD [facility]  06/02/2022 11:45 AM Megan Salon, MD [facility]  06/16/2022  9:45 AM Megan Salon, MD [facility]  06/30/2022  9:30 AM Megan Salon, MD [facility]  07/07/2022 11:00 AM Megan Salon, MD [facility]  07/14/2022 10:15 AM Megan Salon, MD [facility]  07/21/2022 10:15 AM Megan Salon, MD [facility]  07/31/2022 10:45 AM Megan Salon, MD [facility]    Darlina Rumpf, CNM

## 2022-03-03 ENCOUNTER — Ambulatory Visit: Payer: 59 | Admitting: *Deleted

## 2022-03-03 ENCOUNTER — Other Ambulatory Visit: Payer: Self-pay | Admitting: *Deleted

## 2022-03-03 ENCOUNTER — Ambulatory Visit: Payer: 59 | Attending: Obstetrics & Gynecology

## 2022-03-03 ENCOUNTER — Encounter: Payer: Self-pay | Admitting: *Deleted

## 2022-03-03 VITALS — BP 123/66 | HR 82

## 2022-03-03 DIAGNOSIS — O99210 Obesity complicating pregnancy, unspecified trimester: Secondary | ICD-10-CM | POA: Diagnosis present

## 2022-03-03 DIAGNOSIS — O0990 Supervision of high risk pregnancy, unspecified, unspecified trimester: Secondary | ICD-10-CM | POA: Insufficient documentation

## 2022-03-03 DIAGNOSIS — O99212 Obesity complicating pregnancy, second trimester: Secondary | ICD-10-CM

## 2022-03-03 DIAGNOSIS — O09292 Supervision of pregnancy with other poor reproductive or obstetric history, second trimester: Secondary | ICD-10-CM

## 2022-03-03 DIAGNOSIS — O0991 Supervision of high risk pregnancy, unspecified, first trimester: Secondary | ICD-10-CM | POA: Insufficient documentation

## 2022-03-14 ENCOUNTER — Ambulatory Visit (INDEPENDENT_AMBULATORY_CARE_PROVIDER_SITE_OTHER): Payer: 59 | Admitting: Advanced Practice Midwife

## 2022-03-14 VITALS — BP 118/66 | HR 80 | Wt 201.2 lb

## 2022-03-14 DIAGNOSIS — O0992 Supervision of high risk pregnancy, unspecified, second trimester: Secondary | ICD-10-CM

## 2022-03-14 DIAGNOSIS — M549 Dorsalgia, unspecified: Secondary | ICD-10-CM

## 2022-03-14 DIAGNOSIS — Z8759 Personal history of other complications of pregnancy, childbirth and the puerperium: Secondary | ICD-10-CM

## 2022-03-14 DIAGNOSIS — O99210 Obesity complicating pregnancy, unspecified trimester: Secondary | ICD-10-CM

## 2022-03-14 DIAGNOSIS — O99212 Obesity complicating pregnancy, second trimester: Secondary | ICD-10-CM

## 2022-03-14 DIAGNOSIS — O99891 Other specified diseases and conditions complicating pregnancy: Secondary | ICD-10-CM

## 2022-03-14 NOTE — Progress Notes (Signed)
   PRENATAL VISIT NOTE  Subjective:  Angela Lozano is a 26 y.o. G2P1001 at [redacted]w[redacted]d being seen today for ongoing prenatal care.  She is currently monitored for the following issues for this high-risk pregnancy and has Supervision of high risk pregnancy, antepartum; History of pre-eclampsia; BMI 37.0-37.9, adult; and Obesity affecting pregnancy on their problem list.  Patient reports backache.  Contractions: Not present. Vag. Bleeding: None.  Movement: Present. Denies leaking of fluid.   The following portions of the patient's history were reviewed and updated as appropriate: allergies, current medications, past family history, past medical history, past social history, past surgical history and problem list.   Objective:   Vitals:   03/14/22 1038  BP: 118/66  Pulse: 80  Weight: 201 lb 3.2 oz (91.3 kg)    Fetal Status: Fetal Heart Rate (bpm): 138 Fundal Height: 22 cm Movement: Present     General:  Alert, oriented and cooperative. Patient is in no acute distress.  Skin: Skin is warm and dry. No rash noted.   Cardiovascular: Normal heart rate noted  Respiratory: Normal respiratory effort, no problems with respiration noted  Abdomen: Soft, gravid, appropriate for gestational age.  Pain/Pressure: Absent     Pelvic: Cervical exam deferred        Extremities: Normal range of motion.  Edema: None  Mental Status: Normal mood and affect. Normal behavior. Normal judgment and thought content.   Assessment and Plan:  Pregnancy: G2P1001 at [redacted]w[redacted]d 1. Supervision of high risk pregnancy in second trimester --Anticipatory guidance about next visits/weeks of pregnancy given.   2. Obesity affecting pregnancy, antepartum   3. History of pre-eclampsia --normotensive this pregnancy  4. Back pain affecting pregnancy in second trimester --Intermittent, mild Rest/ice/heat/warm bath/increase PO fluids/Tylenol/pregnancy support belt  --Discussed options for support belt with pt, who is  changing insurance. May buy on [place] or file for insurance coverage for belt.  Preterm labor symptoms and general obstetric precautions including but not limited to vaginal bleeding, contractions, leaking of fluid and fetal movement were reviewed in detail with the patient. Please refer to After Visit Summary for other counseling recommendations.   Return for As scheduled.  Future Appointments  Date Time Provider Department Center  03/31/2022  2:30 PM [facility] NURSE [facility]  03/31/2022  2:45 PM [facility] US5 [facility]  05/05/2022 10:15 AM Jerene Bears, MD [facility]  05/19/2022 11:00 AM Marny Lowenstein, PA-C [facility]  06/02/2022  9:15 AM [facility] NURSE [facility]  06/02/2022  9:30 AM [facility] US3 [facility]  06/02/2022 11:45 AM Jerene Bears, MD [facility]  06/16/2022  9:45 AM Jerene Bears, MD [facility]  06/30/2022  9:30 AM Jerene Bears, MD [facility]  07/07/2022 11:00 AM Jerene Bears, MD [facility]  07/14/2022 10:15 AM Jerene Bears, MD [facility]  07/21/2022 10:15 AM Jerene Bears, MD [facility]  07/31/2022 10:45 AM Jerene Bears, MD [facility]    Sharen Counter, CNM

## 2022-03-22 ENCOUNTER — Encounter (HOSPITAL_BASED_OUTPATIENT_CLINIC_OR_DEPARTMENT_OTHER): Payer: Self-pay | Admitting: Advanced Practice Midwife

## 2022-03-31 ENCOUNTER — Ambulatory Visit: Payer: 59 | Attending: Maternal & Fetal Medicine

## 2022-03-31 ENCOUNTER — Ambulatory Visit: Payer: 59 | Admitting: *Deleted

## 2022-03-31 VITALS — BP 132/65 | HR 73

## 2022-03-31 DIAGNOSIS — E669 Obesity, unspecified: Secondary | ICD-10-CM

## 2022-03-31 DIAGNOSIS — O99212 Obesity complicating pregnancy, second trimester: Secondary | ICD-10-CM | POA: Insufficient documentation

## 2022-03-31 DIAGNOSIS — O0990 Supervision of high risk pregnancy, unspecified, unspecified trimester: Secondary | ICD-10-CM

## 2022-03-31 DIAGNOSIS — O09292 Supervision of pregnancy with other poor reproductive or obstetric history, second trimester: Secondary | ICD-10-CM | POA: Diagnosis not present

## 2022-03-31 DIAGNOSIS — O358XX0 Maternal care for other (suspected) fetal abnormality and damage, not applicable or unspecified: Secondary | ICD-10-CM

## 2022-03-31 DIAGNOSIS — Z3A23 23 weeks gestation of pregnancy: Secondary | ICD-10-CM

## 2022-04-12 ENCOUNTER — Ambulatory Visit (INDEPENDENT_AMBULATORY_CARE_PROVIDER_SITE_OTHER): Payer: 59 | Admitting: Medical

## 2022-04-12 ENCOUNTER — Encounter (HOSPITAL_BASED_OUTPATIENT_CLINIC_OR_DEPARTMENT_OTHER): Payer: Self-pay | Admitting: Medical

## 2022-04-12 VITALS — BP 124/69 | HR 92 | Wt 206.0 lb

## 2022-04-12 DIAGNOSIS — Z3A24 24 weeks gestation of pregnancy: Secondary | ICD-10-CM

## 2022-04-12 DIAGNOSIS — Z8759 Personal history of other complications of pregnancy, childbirth and the puerperium: Secondary | ICD-10-CM

## 2022-04-12 DIAGNOSIS — O99212 Obesity complicating pregnancy, second trimester: Secondary | ICD-10-CM

## 2022-04-12 DIAGNOSIS — O0992 Supervision of high risk pregnancy, unspecified, second trimester: Secondary | ICD-10-CM

## 2022-04-12 DIAGNOSIS — O0990 Supervision of high risk pregnancy, unspecified, unspecified trimester: Secondary | ICD-10-CM

## 2022-04-12 NOTE — Patient Instructions (Signed)
Remember that at your next visit you will get labs drawn. You need to be fasting (nothing to eat or drink for at least 8 hours prior to arrival) for those labs. We will be testing for gestational diabetes, anemia, HIV and syphilis. You will be in the office for at least 2 hours to complete the lab tests and your visit will occur during this time. You will also be offered the TDAP vaccine for whooping cough.  

## 2022-04-12 NOTE — Progress Notes (Signed)
   PRENATAL VISIT NOTE  Subjective:  Angela Lozano is a 26 y.o. G2P1001 at [redacted]w[redacted]d being seen today for ongoing prenatal care.  She is currently monitored for the following issues for this high-risk pregnancy and has Supervision of high risk pregnancy, antepartum; History of pre-eclampsia; BMI 37.0-37.9, adult; and Obesity affecting pregnancy on their problem list.  Patient reports backache and occasional contractions.  Contractions: Not present. Vag. Bleeding: None.  Movement: Present. Denies leaking of fluid.   The following portions of the patient's history were reviewed and updated as appropriate: allergies, current medications, past family history, past medical history, past social history, past surgical history and problem list.   Objective:   Vitals:   04/12/22 1418  BP: 124/69  Pulse: 92  Weight: 206 lb (93.4 kg)    Fetal Status: Fetal Heart Rate (bpm): 150   Movement: Present     General:  Alert, oriented and cooperative. Patient is in no acute distress.  Skin: Skin is warm and dry. No rash noted.   Cardiovascular: Normal heart rate noted  Respiratory: Normal respiratory effort, no problems with respiration noted  Abdomen: Soft, gravid, appropriate for gestational age.  Pain/Pressure: Present     Pelvic: Cervical exam deferred        Extremities: Normal range of motion.  Edema: None  Mental Status: Normal mood and affect. Normal behavior. Normal judgment and thought content.   Assessment and Plan:  Pregnancy: G2P1001 at [redacted]w[redacted]d 1. Supervision of high risk pregnancy, antepartum - Continued with some back and pelvic pain intermittently especially with activity  - Discussed maternity belt, heat, ice, hydrotherapy and tylenol PRN  - Hyperpigmentation noted by patient in axilla, around neck and groin, advised this could be a form of melasma due to pregnancy and will likely resolve. This pay also indicate acanthosis nigricans. Discussed GTT at next visit.   2. History  of pre-eclampsia - Normotensive today   3. Obesity affecting pregnancy in second trimester - Growth [place] scheduled 06/02/22  4. [redacted] weeks gestation of pregnancy  Preterm labor symptoms and general obstetric precautions including but not limited to vaginal bleeding, contractions, leaking of fluid and fetal movement were reviewed in detail with the patient. Please refer to After Visit Summary for other counseling recommendations.   Return in about 4 weeks (around 05/10/2022) for LOB, 28 week labs (fasting), any provider, In-Person.  Future Appointments  Date Time Provider Department Center  05/05/2022 10:15 AM Jerene Bears, MD [facility]  05/19/2022 10:55 AM Marny Lowenstein, PA-C [facility]  06/02/2022  9:15 AM [facility] NURSE [facility]  06/02/2022  9:30 AM [facility] US3 [facility]  06/02/2022 11:45 AM Jerene Bears, MD [facility]  06/16/2022  9:45 AM Jerene Bears, MD [facility]  06/30/2022  9:30 AM Jerene Bears, MD [facility]  07/07/2022 11:00 AM Jerene Bears, MD [facility]  07/14/2022 10:15 AM Jerene Bears, MD [facility]  07/21/2022 10:15 AM Jerene Bears, MD [facility]  07/31/2022 10:45 AM Jerene Bears, MD [facility]    Vonzella Nipple, PA-C

## 2022-05-05 ENCOUNTER — Ambulatory Visit (INDEPENDENT_AMBULATORY_CARE_PROVIDER_SITE_OTHER): Payer: Medicaid Other | Admitting: Obstetrics & Gynecology

## 2022-05-05 VITALS — BP 127/73 | HR 98 | Wt 207.4 lb

## 2022-05-05 DIAGNOSIS — Z3482 Encounter for supervision of other normal pregnancy, second trimester: Secondary | ICD-10-CM

## 2022-05-05 DIAGNOSIS — Z6837 Body mass index (BMI) 37.0-37.9, adult: Secondary | ICD-10-CM

## 2022-05-05 DIAGNOSIS — Z8759 Personal history of other complications of pregnancy, childbirth and the puerperium: Secondary | ICD-10-CM

## 2022-05-05 DIAGNOSIS — Z3A28 28 weeks gestation of pregnancy: Secondary | ICD-10-CM

## 2022-05-05 DIAGNOSIS — O0990 Supervision of high risk pregnancy, unspecified, unspecified trimester: Secondary | ICD-10-CM

## 2022-05-06 LAB — RPR: RPR Ser Ql: NONREACTIVE

## 2022-05-06 LAB — GLUCOSE TOLERANCE, 2 HOURS W/ 1HR
Glucose, 1 hour: 116 mg/dL (ref 70–179)
Glucose, 2 hour: 120 mg/dL (ref 70–152)
Glucose, Fasting: 87 mg/dL (ref 70–91)

## 2022-05-06 LAB — HIV ANTIBODY (ROUTINE TESTING W REFLEX): HIV Screen 4th Generation wRfx: NONREACTIVE

## 2022-05-06 LAB — CBC
Hematocrit: 35.4 % (ref 34.0–46.6)
Hemoglobin: 11.2 g/dL (ref 11.1–15.9)
MCH: 25.9 pg — ABNORMAL LOW (ref 26.6–33.0)
MCHC: 31.6 g/dL (ref 31.5–35.7)
MCV: 82 fL (ref 79–97)
Platelets: 236 10*3/uL (ref 150–450)
RBC: 4.32 x10E6/uL (ref 3.77–5.28)
RDW: 13.7 % (ref 11.7–15.4)
WBC: 7.5 10*3/uL (ref 3.4–10.8)

## 2022-05-09 NOTE — Progress Notes (Signed)
   PRENATAL VISIT NOTE  Subjective:  Angela Lozano is a 26 y.o. G2P1001 at [redacted]w[redacted]d being seen today for ongoing prenatal care.  She is currently monitored for the following issues for this high-risk pregnancy and has Supervision of high risk pregnancy, antepartum; History of pre-eclampsia; BMI 37.0-37.9, adult; and Obesity affecting pregnancy on their problem list.  Patient reports no complaints.  Contractions: Not present. Vag. Bleeding: None.  Movement: Present. Denies leaking of fluid.   The following portions of the patient's history were reviewed and updated as appropriate: allergies, current medications, past family history, past medical history, past social history, past surgical history and problem list.   Objective:   Vitals:   05/05/22 0911  BP: 127/73  Pulse: 98  Weight: 207 lb 6.4 oz (94.1 kg)    Fetal Status: Fetal Heart Rate (bpm): 146   Movement: Present     General:  Alert, oriented and cooperative. Patient is in no acute distress.  Skin: Skin is warm and dry. No rash noted.   Cardiovascular: Normal heart rate noted  Respiratory: Normal respiratory effort, no problems with respiration noted  Abdomen: Soft, gravid, appropriate for gestational age.  Pain/Pressure: Absent     Pelvic: Cervical exam deferred        Extremities: Normal range of motion.  Edema: None  Mental Status: Normal mood and affect. Normal behavior. Normal judgment and thought content.   Assessment and Plan:  Pregnancy: G2P1001 at [redacted]w[redacted]d 1. Supervision of high risk pregnancy, antepartum - on PNV and baby ASA - CBC - Glucose Tolerance, 2 Hours w/1 Hour - HIV Antibody (routine testing w rflx) - RPR - has 23 week u/s and follow up scheduled around 32 weeks - follow up 3 weeks.  Requests to have Tdap given at that visit.  2. Prenatal care, subsequent pregnancy, second trimester  3. [redacted] weeks gestation of pregnancy  4. History of pre-eclampsia  5. BMI 37.0-37.9, adult  Preterm labor  symptoms and general obstetric precautions including but not limited to vaginal bleeding, contractions, leaking of fluid and fetal movement were reviewed in detail with the patient. Please refer to After Visit Summary for other counseling recommendations.   Return in about 3 weeks (around 05/26/2022).  Future Appointments  Date Time Provider Department Center  05/19/2022 10:55 AM Marny Lowenstein, PA-C [facility]  06/02/2022  9:15 AM [facility] NURSE [facility]  06/02/2022  9:30 AM [facility] US3 [facility]  06/02/2022 10:15 AM Marny Lowenstein, PA-C [facility]  06/16/2022  9:45 AM Jerene Bears, MD [facility]  06/30/2022  9:30 AM Marny Lowenstein, PA-C [facility]  07/07/2022 11:00 AM Jerene Bears, MD [facility]  07/14/2022 10:15 AM Jerene Bears, MD [facility]  07/21/2022 10:15 AM Jerene Bears, MD [facility]  07/31/2022 10:45 AM Jerene Bears, MD [facility]    Jerene Bears, MD

## 2022-05-19 ENCOUNTER — Encounter (HOSPITAL_BASED_OUTPATIENT_CLINIC_OR_DEPARTMENT_OTHER): Payer: Self-pay | Admitting: Medical

## 2022-05-19 ENCOUNTER — Ambulatory Visit (INDEPENDENT_AMBULATORY_CARE_PROVIDER_SITE_OTHER): Payer: Medicaid Other | Admitting: Medical

## 2022-05-19 VITALS — BP 120/70 | HR 80 | Wt 207.4 lb

## 2022-05-19 DIAGNOSIS — O99213 Obesity complicating pregnancy, third trimester: Secondary | ICD-10-CM

## 2022-05-19 DIAGNOSIS — Z8759 Personal history of other complications of pregnancy, childbirth and the puerperium: Secondary | ICD-10-CM

## 2022-05-19 DIAGNOSIS — O0993 Supervision of high risk pregnancy, unspecified, third trimester: Secondary | ICD-10-CM

## 2022-05-19 DIAGNOSIS — Z3A30 30 weeks gestation of pregnancy: Secondary | ICD-10-CM

## 2022-05-19 NOTE — Progress Notes (Signed)
   PRENATAL VISIT NOTE  Subjective:  Angela Lozano is a 26 y.o. G2P1001 at [redacted]w[redacted]d being seen today for ongoing prenatal care.  She is currently monitored for the following issues for this high-risk pregnancy and has Supervision of high risk pregnancy, antepartum; History of pre-eclampsia; BMI 37.0-37.9, adult; and Obesity affecting pregnancy on their problem list.  Patient reports fatigue.  Contractions: Not present. Vag. Bleeding: None.  Movement: Present. Denies leaking of fluid.   The following portions of the patient's history were reviewed and updated as appropriate: allergies, current medications, past family history, past medical history, past social history, past surgical history and problem list.   Objective:   Vitals:   05/19/22 1116  BP: 120/70  Pulse: 80  Weight: 207 lb 6.4 oz (94.1 kg)    Fetal Status: Fetal Heart Rate (bpm): 142 Fundal Height: 31 cm Movement: Present     General:  Alert, oriented and cooperative. Patient is in no acute distress.  Skin: Skin is warm and dry. No rash noted.   Cardiovascular: Normal heart rate noted  Respiratory: Normal respiratory effort, no problems with respiration noted  Abdomen: Soft, gravid, appropriate for gestational age.  Pain/Pressure: Absent     Pelvic: Cervical exam deferred        Extremities: Normal range of motion.  Edema: None  Mental Status: Normal mood and affect. Normal behavior. Normal judgment and thought content.   Assessment and Plan:  Pregnancy: G2P1001 at [redacted]w[redacted]d 1. Supervision of high risk pregnancy in third trimester - Reviewed normal third trimester labs from recent visit   2. History of pre-eclampsia - BASA - Normotensive today   3. Obesity affecting pregnancy in third trimester - Growth [place] scheduled 06/02/22 - Last [place] EFW 62%  4. [redacted] weeks gestation of pregnancy  Preterm labor symptoms and general obstetric precautions including but not limited to vaginal bleeding, contractions, leaking of  fluid and fetal movement were reviewed in detail with the patient. Please refer to After Visit Summary for other counseling recommendations.   Return in about 2 weeks (around 06/02/2022) for [facility] APP, any provider, In-Person.  Future Appointments  Date Time Provider Department Center  06/02/2022  9:15 AM [facility] NURSE [facility]  06/02/2022  9:30 AM [facility] US3 [facility]  06/02/2022 11:15 AM Jerene Bears, MD [facility]  06/16/2022  9:45 AM Jerene Bears, MD [facility]  06/30/2022  9:35 AM Marny Lowenstein, PA-C [facility]  07/07/2022 11:00 AM Jerene Bears, MD [facility]  07/14/2022 10:15 AM Jerene Bears, MD [facility]  07/21/2022 10:15 AM Jerene Bears, MD [facility]  07/31/2022 10:45 AM Jerene Bears, MD [facility]    Vonzella Nipple, PA-C

## 2022-06-01 ENCOUNTER — Telehealth: Payer: Self-pay

## 2022-06-01 NOTE — Telephone Encounter (Signed)
Left message for patient to call office ASAP - scheduling conflict with tomorrows ultrasound appt.  -  would like for patient to be here today at 12:30pm

## 2022-06-02 ENCOUNTER — Ambulatory Visit: Payer: 59

## 2022-06-02 ENCOUNTER — Ambulatory Visit (INDEPENDENT_AMBULATORY_CARE_PROVIDER_SITE_OTHER): Payer: Medicaid Other | Admitting: Obstetrics & Gynecology

## 2022-06-02 ENCOUNTER — Encounter (HOSPITAL_BASED_OUTPATIENT_CLINIC_OR_DEPARTMENT_OTHER): Payer: Self-pay | Admitting: Obstetrics & Gynecology

## 2022-06-02 ENCOUNTER — Ambulatory Visit: Payer: Medicaid Other

## 2022-06-02 VITALS — BP 110/75 | HR 101 | Wt 209.8 lb

## 2022-06-02 DIAGNOSIS — Z23 Encounter for immunization: Secondary | ICD-10-CM

## 2022-06-02 DIAGNOSIS — O0990 Supervision of high risk pregnancy, unspecified, unspecified trimester: Secondary | ICD-10-CM

## 2022-06-02 DIAGNOSIS — Z3A32 32 weeks gestation of pregnancy: Secondary | ICD-10-CM

## 2022-06-02 DIAGNOSIS — Z8759 Personal history of other complications of pregnancy, childbirth and the puerperium: Secondary | ICD-10-CM

## 2022-06-02 DIAGNOSIS — O99213 Obesity complicating pregnancy, third trimester: Secondary | ICD-10-CM

## 2022-06-02 DIAGNOSIS — Z6837 Body mass index (BMI) 37.0-37.9, adult: Secondary | ICD-10-CM

## 2022-06-02 NOTE — Progress Notes (Signed)
   PRENATAL VISIT NOTE  Subjective:  Angela Lozano is a 26 y.o. G2P1001 at [redacted]w[redacted]d being seen today for ongoing prenatal care.  She is currently monitored for the following issues for this high-risk pregnancy and has Supervision of high risk pregnancy, antepartum; History of pre-eclampsia; BMI 37.0-37.9, adult; and Obesity affecting pregnancy on their problem list.  Patient reports no complaints.  Contractions: Not present. Vag. Bleeding: None.  Movement: Present. Denies leaking of fluid.   The following portions of the patient's history were reviewed and updated as appropriate: allergies, current medications, past family history, past medical history, past social history, past surgical history and problem list.   Objective:   Vitals:   06/02/22 1128  BP: 110/75  Pulse: (!) 101  Weight: 209 lb 12.8 oz (95.2 kg)    Fetal Status: Fetal Heart Rate (bpm): 138   Movement: Present     General:  Alert, oriented and cooperative. Patient is in no acute distress.  Skin: Skin is warm and dry. No rash noted.   Cardiovascular: Normal heart rate noted  Respiratory: Normal respiratory effort, no problems with respiration noted  Abdomen: Soft, gravid, appropriate for gestational age.  Pain/Pressure: Absent     Pelvic: Cervical exam deferred        Extremities: Normal range of motion.  Edema: None  Mental Status: Normal mood and affect. Normal behavior. Normal judgment and thought content.   Assessment and Plan:  Pregnancy: G2P1001 at [redacted]w[redacted]d 1. Supervision of high risk pregnancy, antepartum - on PNV and baby ASA  2. [redacted] weeks gestation of pregnancy - recheck 2 wweeks  3. BMI 37.0-37.9, adult - has u/s scheduled for Monday with MFM  4. Obesity affecting pregnancy in third trimester - all diabetes testing has been negative  5. History of pre-eclampsia  Preterm labor symptoms and general obstetric precautions including but not limited to vaginal bleeding, contractions, leaking of  fluid and fetal movement were reviewed in detail with the patient. Please refer to After Visit Summary for other counseling recommendations.   Return in about 2 weeks (around 06/16/2022).  Future Appointments  Date Time Provider [place]  06/05/2022  3:30 PM [facility] NURSE [facility]  06/05/2022  3:45 PM [facility] US7 [facility]  06/30/2022  9:35 AM Luvenia Redden, PA-C [facility]  07/07/2022 11:00 AM Megan Salon, MD [facility]  07/14/2022 10:15 AM Megan Salon, MD [facility]  07/21/2022 10:15 AM Megan Salon, MD [facility]  07/31/2022 10:45 AM Megan Salon, MD [facility]    Megan Salon, MD

## 2022-06-05 ENCOUNTER — Ambulatory Visit: Payer: Medicaid Other | Admitting: *Deleted

## 2022-06-05 ENCOUNTER — Ambulatory Visit: Payer: Medicaid Other | Attending: Maternal & Fetal Medicine

## 2022-06-05 ENCOUNTER — Encounter: Payer: Self-pay | Admitting: *Deleted

## 2022-06-05 VITALS — BP 119/59 | HR 93

## 2022-06-05 DIAGNOSIS — O09292 Supervision of pregnancy with other poor reproductive or obstetric history, second trimester: Secondary | ICD-10-CM | POA: Diagnosis present

## 2022-06-05 DIAGNOSIS — O99212 Obesity complicating pregnancy, second trimester: Secondary | ICD-10-CM | POA: Diagnosis present

## 2022-06-05 DIAGNOSIS — O0990 Supervision of high risk pregnancy, unspecified, unspecified trimester: Secondary | ICD-10-CM | POA: Insufficient documentation

## 2022-06-06 ENCOUNTER — Other Ambulatory Visit: Payer: Self-pay | Admitting: *Deleted

## 2022-06-06 DIAGNOSIS — O99213 Obesity complicating pregnancy, third trimester: Secondary | ICD-10-CM

## 2022-06-16 ENCOUNTER — Encounter (HOSPITAL_BASED_OUTPATIENT_CLINIC_OR_DEPARTMENT_OTHER): Payer: 59 | Admitting: Obstetrics & Gynecology

## 2022-06-20 ENCOUNTER — Ambulatory Visit (INDEPENDENT_AMBULATORY_CARE_PROVIDER_SITE_OTHER): Payer: Medicaid Other | Admitting: Advanced Practice Midwife

## 2022-06-20 ENCOUNTER — Encounter (HOSPITAL_BASED_OUTPATIENT_CLINIC_OR_DEPARTMENT_OTHER): Payer: Self-pay | Admitting: Advanced Practice Midwife

## 2022-06-20 VITALS — BP 132/76 | HR 93 | Wt 213.0 lb

## 2022-06-20 DIAGNOSIS — Z3A34 34 weeks gestation of pregnancy: Secondary | ICD-10-CM

## 2022-06-20 DIAGNOSIS — O0990 Supervision of high risk pregnancy, unspecified, unspecified trimester: Secondary | ICD-10-CM

## 2022-06-20 DIAGNOSIS — O99213 Obesity complicating pregnancy, third trimester: Secondary | ICD-10-CM

## 2022-06-20 NOTE — Progress Notes (Signed)
   PRENATAL VISIT NOTE  Subjective:  Angela Lozano is a 26 y.o. G2P1001 at [redacted]w[redacted]d being seen today for ongoing prenatal care.  She is currently monitored for the following issues for this low-risk pregnancy and has Supervision of high risk pregnancy, antepartum; History of pre-eclampsia; BMI 37.0-37.9, adult; and Obesity affecting pregnancy on their problem list.  Patient reports no complaints.  Contractions: Not present. Vag. Bleeding: None.  Movement: Present. Denies leaking of fluid.   The following portions of the patient's history were reviewed and updated as appropriate: allergies, current medications, past family history, past medical history, past social history, past surgical history and problem list.   Objective:   Vitals:   06/20/22 1322  BP: 132/76  Pulse: 93  Weight: 213 lb (96.6 kg)    Fetal Status: Fetal Heart Rate (bpm): 136 Fundal Height: 35 cm Movement: Present  Presentation: Vertex  General:  Alert, oriented and cooperative. Patient is in no acute distress.  Skin: Skin is warm and dry. No rash noted.   Cardiovascular: Normal heart rate noted  Respiratory: Normal respiratory effort, no problems with respiration noted  Abdomen: Soft, gravid, appropriate for gestational age.  Pain/Pressure: Absent     Pelvic: Cervical exam deferred        Extremities: Normal range of motion.     Mental Status: Normal mood and affect. Normal behavior. Normal judgment and thought content.   Assessment and Plan:  Pregnancy: G2P1001 at [redacted]w[redacted]d 1. Supervision of high risk pregnancy, antepartum --Anticipatory guidance about next visits/weeks of pregnancy given.  --Pt asked about breech position noted on her last ultrasound.  Difficult to assess position by [place] so bedside US performed and vertex position confirmed.   --Reviewed labor readiness with patient to start after 36-37 weeks including the [employer], evening primrose oil, and raspberry leaf tea.     2. Obesity  affecting pregnancy in third trimester, unspecified obesity type   3. [redacted] weeks gestation of pregnancy   Preterm labor symptoms and general obstetric precautions including but not limited to vaginal bleeding, contractions, leaking of fluid and fetal movement were reviewed in detail with the patient. Please refer to After Visit Summary for other counseling recommendations.   Return in about 2 weeks (around 07/04/2022) for As scheduled.  Future Appointments  Date Time Provider [place]  06/30/2022  9:35 AM Danielle Rankin [facility]  07/04/2022  9:45 AM [facility] NURSE [facility]  07/04/2022 10:00 AM [facility] US7 [facility]  07/07/2022 11:00 AM Megan Salon, MD [facility]  07/14/2022 10:15 AM Megan Salon, MD [facility]  07/21/2022 10:15 AM Megan Salon, MD [facility]  07/31/2022 10:45 AM Megan Salon, MD [facility]    Fatima Blank, CNM

## 2022-06-30 ENCOUNTER — Ambulatory Visit (INDEPENDENT_AMBULATORY_CARE_PROVIDER_SITE_OTHER): Payer: Medicaid Other | Admitting: Medical

## 2022-06-30 ENCOUNTER — Other Ambulatory Visit (HOSPITAL_COMMUNITY)
Admission: RE | Admit: 2022-06-30 | Discharge: 2022-06-30 | Disposition: A | Discharge status: Home / Self Care | Payer: Medicaid Other | Source: Ambulatory Visit | Attending: Obstetrics & Gynecology | Admitting: Obstetrics & Gynecology

## 2022-06-30 ENCOUNTER — Encounter (HOSPITAL_BASED_OUTPATIENT_CLINIC_OR_DEPARTMENT_OTHER): Payer: Self-pay | Admitting: Medical

## 2022-06-30 VITALS — BP 127/79 | HR 91 | Wt 214.0 lb

## 2022-06-30 DIAGNOSIS — E6689 Other obesity not elsewhere classified: Secondary | ICD-10-CM

## 2022-06-30 DIAGNOSIS — Z3A36 36 weeks gestation of pregnancy: Secondary | ICD-10-CM

## 2022-06-30 DIAGNOSIS — O0993 Supervision of high risk pregnancy, unspecified, third trimester: Secondary | ICD-10-CM | POA: Diagnosis present

## 2022-06-30 DIAGNOSIS — E668 Other obesity: Secondary | ICD-10-CM

## 2022-06-30 DIAGNOSIS — Z8759 Personal history of other complications of pregnancy, childbirth and the puerperium: Secondary | ICD-10-CM

## 2022-06-30 DIAGNOSIS — O99213 Obesity complicating pregnancy, third trimester: Secondary | ICD-10-CM

## 2022-06-30 NOTE — Progress Notes (Signed)
   PRENATAL VISIT NOTE  Subjective:  Angela Lozano is a 26 y.o. G2P1001 at [redacted]w[redacted]d being seen today for ongoing prenatal care.  She is currently monitored for the following issues for this high-risk pregnancy and has Supervision of high risk pregnancy, antepartum; History of pre-eclampsia; BMI 37.0-37.9, adult; and Obesity affecting pregnancy on their problem list.  Patient reports occasional contractions.  Contractions: Not present. Vag. Bleeding: None.  Movement: Present. Denies leaking of fluid.   The following portions of the patient's history were reviewed and updated as appropriate: allergies, current medications, past family history, past medical history, past social history, past surgical history and problem list.   Objective:   Vitals:   06/30/22 0941  BP: 127/79  Pulse: 91  Weight: 214 lb (97.1 kg)    Fetal Status: Fetal Heart Rate (bpm): 141 Fundal Height: 37 cm Movement: Present  Presentation: Undeterminable  General:  Alert, oriented and cooperative. Patient is in no acute distress.  Skin: Skin is warm and dry. No rash noted.   Cardiovascular: Normal heart rate noted  Respiratory: Normal respiratory effort, no problems with respiration noted  Abdomen: Soft, gravid, appropriate for gestational age.  Pain/Pressure: Absent     Pelvic: Cervical exam performed in the presence of a chaperone Dilation: 1.5 Effacement (%): 50 Station: Ballotable  Extremities: Normal range of motion.  Edema: None  Mental Status: Normal mood and affect. Normal behavior. Normal judgment and thought content.   Assessment and Plan:  Pregnancy: G2P1001 at [redacted]w[redacted]d 1. Supervision of high risk pregnancy in third trimester - Culture, beta strep (group b only) - Cervicovaginal ancillary only( [place])  2. History of pre-eclampsia - Normotensive today   3. Other obesity affecting pregnancy in third trimester - Growth [place] scheduled 10/31, will confirm presentation at [facility]  4. [redacted] weeks  gestation of pregnancy  Preterm labor symptoms and general obstetric precautions including but not limited to vaginal bleeding, contractions, leaking of fluid and fetal movement were reviewed in detail with the patient. Please refer to After Visit Summary for other counseling recommendations.   Return in about 1 week (around 07/07/2022) for LOB, In-Person, any provider, as scheduled.  Future Appointments  Date Time Provider [place]  07/04/2022  9:45 AM [facility] NURSE [facility]  07/04/2022 10:00 AM [facility] US7 [facility]  07/07/2022 11:00 AM Megan Salon, MD [facility]  07/14/2022 10:15 AM Megan Salon, MD [facility]  07/21/2022 10:15 AM Megan Salon, MD [facility]  07/31/2022 10:45 AM Megan Salon, MD [facility]    Kerry Hough, PA-C

## 2022-07-03 ENCOUNTER — Telehealth: Payer: Self-pay

## 2022-07-03 LAB — CERVICOVAGINAL ANCILLARY ONLY
Chlamydia: NEGATIVE
Comment: NEGATIVE
Comment: NORMAL
Neisseria Gonorrhea: NEGATIVE

## 2022-07-03 NOTE — Telephone Encounter (Signed)
Left message for patient to call back to reschedule ultrasound time for tomorrows appointment.  No ultrasound tech. Available at 10am tomorrow.

## 2022-07-04 ENCOUNTER — Ambulatory Visit: Payer: Medicaid Other | Admitting: *Deleted

## 2022-07-04 ENCOUNTER — Ambulatory Visit: Payer: Medicaid Other | Attending: Obstetrics and Gynecology

## 2022-07-04 VITALS — BP 127/81 | HR 87

## 2022-07-04 DIAGNOSIS — O0990 Supervision of high risk pregnancy, unspecified, unspecified trimester: Secondary | ICD-10-CM | POA: Diagnosis present

## 2022-07-04 DIAGNOSIS — Z3A36 36 weeks gestation of pregnancy: Secondary | ICD-10-CM

## 2022-07-04 DIAGNOSIS — Z362 Encounter for other antenatal screening follow-up: Secondary | ICD-10-CM | POA: Diagnosis not present

## 2022-07-04 DIAGNOSIS — O99213 Obesity complicating pregnancy, third trimester: Secondary | ICD-10-CM | POA: Diagnosis not present

## 2022-07-04 DIAGNOSIS — E669 Obesity, unspecified: Secondary | ICD-10-CM

## 2022-07-04 DIAGNOSIS — O09293 Supervision of pregnancy with other poor reproductive or obstetric history, third trimester: Secondary | ICD-10-CM

## 2022-07-04 DIAGNOSIS — E668 Other obesity: Secondary | ICD-10-CM | POA: Diagnosis present

## 2022-07-04 LAB — CULTURE, BETA STREP (GROUP B ONLY): Strep Gp B Culture: NEGATIVE

## 2022-07-07 ENCOUNTER — Inpatient Hospital Stay (HOSPITAL_COMMUNITY)
Admission: AD | Admit: 2022-07-07 | Discharge: 2022-07-07 | Disposition: A | Discharge status: Home / Self Care | Payer: Medicaid Other | Attending: Obstetrics and Gynecology | Admitting: Obstetrics and Gynecology

## 2022-07-07 ENCOUNTER — Ambulatory Visit (INDEPENDENT_AMBULATORY_CARE_PROVIDER_SITE_OTHER): Payer: Medicaid Other | Admitting: Obstetrics & Gynecology

## 2022-07-07 ENCOUNTER — Other Ambulatory Visit: Payer: Self-pay

## 2022-07-07 ENCOUNTER — Encounter (HOSPITAL_COMMUNITY): Payer: Self-pay | Admitting: Obstetrics and Gynecology

## 2022-07-07 ENCOUNTER — Encounter (HOSPITAL_BASED_OUTPATIENT_CLINIC_OR_DEPARTMENT_OTHER): Payer: Self-pay | Admitting: Obstetrics & Gynecology

## 2022-07-07 VITALS — BP 143/88 | HR 95 | Wt 215.8 lb

## 2022-07-07 DIAGNOSIS — E668 Other obesity: Secondary | ICD-10-CM

## 2022-07-07 DIAGNOSIS — Z3A37 37 weeks gestation of pregnancy: Secondary | ICD-10-CM | POA: Diagnosis not present

## 2022-07-07 DIAGNOSIS — O0990 Supervision of high risk pregnancy, unspecified, unspecified trimester: Secondary | ICD-10-CM

## 2022-07-07 DIAGNOSIS — R030 Elevated blood-pressure reading, without diagnosis of hypertension: Secondary | ICD-10-CM

## 2022-07-07 DIAGNOSIS — O26893 Other specified pregnancy related conditions, third trimester: Secondary | ICD-10-CM | POA: Insufficient documentation

## 2022-07-07 DIAGNOSIS — O99213 Obesity complicating pregnancy, third trimester: Secondary | ICD-10-CM

## 2022-07-07 DIAGNOSIS — O163 Unspecified maternal hypertension, third trimester: Secondary | ICD-10-CM

## 2022-07-07 DIAGNOSIS — Z8759 Personal history of other complications of pregnancy, childbirth and the puerperium: Secondary | ICD-10-CM | POA: Diagnosis not present

## 2022-07-07 DIAGNOSIS — Z6837 Body mass index (BMI) 37.0-37.9, adult: Secondary | ICD-10-CM | POA: Diagnosis not present

## 2022-07-07 LAB — COMPREHENSIVE METABOLIC PANEL
ALT: 16 U/L (ref 0–44)
AST: 21 U/L (ref 15–41)
Albumin: 2.8 g/dL — ABNORMAL LOW (ref 3.5–5.0)
Alkaline Phosphatase: 156 U/L — ABNORMAL HIGH (ref 38–126)
Anion gap: 11 (ref 5–15)
BUN: <5 mg/dL — ABNORMAL LOW (ref 6–20)
CO2: 21 mmol/L — ABNORMAL LOW (ref 22–32)
Calcium: 9.1 mg/dL (ref 8.9–10.3)
Chloride: 105 mmol/L (ref 98–111)
Creatinine, Ser: 0.53 mg/dL (ref 0.44–1.00)
GFR, Estimated: >60 mL/min (ref >60)
Glucose, Bld: 119 mg/dL — ABNORMAL HIGH (ref 70–99)
Potassium: 3.6 mmol/L (ref 3.5–5.1)
Sodium: 137 mmol/L (ref 135–145)
Total Bilirubin: 0.2 mg/dL — ABNORMAL LOW (ref 0.3–1.2)
Total Protein: 6.2 g/dL — ABNORMAL LOW (ref 6.5–8.1)

## 2022-07-07 LAB — PROTEIN / CREATININE RATIO, URINE
Creatinine, Urine: 76 mg/dL
Protein Creatinine Ratio: 0.09 mg/mg{Cre} (ref 0.00–0.15)
Total Protein, Urine: 7 mg/dL

## 2022-07-07 LAB — CBC
HCT: 33.7 % — ABNORMAL LOW (ref 36.0–46.0)
Hemoglobin: 10.7 g/dL — ABNORMAL LOW (ref 12.0–15.0)
MCH: 24.7 pg — ABNORMAL LOW (ref 26.0–34.0)
MCHC: 31.8 g/dL (ref 30.0–36.0)
MCV: 77.6 fL — ABNORMAL LOW (ref 80.0–100.0)
Platelets: 272 10*3/uL (ref 150–400)
RBC: 4.34 MIL/uL (ref 3.87–5.11)
RDW: 14.4 % (ref 11.5–15.5)
WBC: 6.5 10*3/uL (ref 4.0–10.5)
nRBC: 0 % (ref 0.0–0.2)

## 2022-07-07 LAB — URINALYSIS, ROUTINE W REFLEX MICROSCOPIC
Bilirubin Urine: NEGATIVE
Glucose, UA: NEGATIVE mg/dL
Hgb urine dipstick: NEGATIVE
Ketones, ur: NEGATIVE mg/dL
Leukocytes,Ua: NEGATIVE
Nitrite: NEGATIVE
Protein, ur: NEGATIVE mg/dL
Specific Gravity, Urine: 1.013 (ref 1.005–1.030)
pH: 7 (ref 5.0–8.0)

## 2022-07-07 NOTE — MAU Note (Addendum)
Angela Lozano is a 26 y.o. at [redacted]w[redacted]d here in MAU reporting: vision has been blurry intermittently since Monday and then at [facility] visit BP was elevated. Was sent over for [facility] evaluation. No headaches, but does report intermittent RUQ pain. No bleeding or LOF. +FM  Onset of complaint: ongoing  Pain score: 0/10  Vitals:   07/07/22 1257  BP: (!) 147/84  Pulse: (!) 121  Resp: 16  Temp: 98.7 F (37.1 C)  SpO2: 100%     FHT:148  Lab orders placed from triage: ua

## 2022-07-07 NOTE — MAU Provider Note (Signed)
History     [id number]  Arrival date and time: 07/07/22 1246    Chief Complaint  Patient presents with   Hypertension     HPI Angela Lozano is a 26 y.o. at [redacted]w[redacted]d who presents for hypertension. Patient had elevated BP today at her routine OB visit. Has history of gestational hypertension with her last pregnancy. Denies hypertension outside of pregnancy. Reports some ongoing blurred vision but denies scotoma or photopsia. Denies headache or epigastric pain. Reports good fetal movement.    OB History     Gravida  2   Para  1   Term  1   Preterm      AB      Living  1      SAB      IAB      Ectopic      Multiple  0   Live Births  1           Past Medical History:  Diagnosis Date   Gallstone    History of gestational hypertension     Past Surgical History:  Procedure Laterality Date   CHOLECYSTECTOMY N/A 06/10/2021   Procedure: LAPAROSCOPIC CHOLECYSTECTOMY;  Surgeon: Berna Bue, MD;  Location: [facility] OR;  Service: General;  Laterality: N/A;    Family History  Problem Relation Age of Onset   Miscarriages / Stillbirths Mother    Hypertension Father    Hyperlipidemia Father    Diabetes Maternal Grandmother    Hearing loss Maternal Grandfather     No Known Allergies  No current facility-administered medications on file prior to encounter.   Current Outpatient Medications on File Prior to Encounter  Medication Sig Dispense Refill   aspirin EC 81 MG tablet Take 1 tablet (81 mg total) by mouth daily. Swallow whole. 30 tablet 6   Prenatal Vit-Fe Fumarate-FA (MULTIVITAMIN-PRENATAL) 27-0.8 MG TABS tablet Take 1 tablet by mouth daily at 12 noon.       ROS Pertinent positives and negative per HPI, all others reviewed and negative  Physical Exam   BP 132/74   Pulse (!) 116   Temp 98.9 F (37.2 C) (Oral)   Resp 18   Ht 5\' 1"  (1.549 m)   Wt 97.2 kg   LMP 10/14/2021 (Exact Date)   SpO2 99%   BMI 40.49 kg/m   Patient Vitals for  the past 24 hrs:  BP Temp Temp src Pulse Resp SpO2 Height Weight  07/07/22 1430 132/74 -- -- (!) 116 -- 99 % -- --  07/07/22 1415 118/80 -- -- (!) 105 -- 100 % -- --  07/07/22 1400 125/76 -- -- (!) 104 -- 100 % -- --  07/07/22 1345 138/85 -- -- (!) 102 -- 99 % -- --  07/07/22 1330 131/80 -- -- (!) 104 -- 98 % -- --  07/07/22 1322 136/83 98.9 F (37.2 C) Oral (!) 102 18 98 % -- --  07/07/22 1257 (!) 147/84 98.7 F (37.1 C) Oral (!) 121 16 100 % -- --  07/07/22 1252 -- -- -- -- -- -- 5\' 1"  (1.549 m) 97.2 kg    Physical Exam Vitals and nursing note reviewed.  Constitutional:      General: She is not in acute distress.    Appearance: Normal appearance.  HENT:     Head: Normocephalic and atraumatic.  Eyes:     General: No scleral icterus.    Conjunctiva/sclera: Conjunctivae normal.  Pulmonary:     Effort: Pulmonary effort is normal.  No respiratory distress.  Neurological:     Mental Status: She is alert.  Psychiatric:        Mood and Affect: Mood normal.        Behavior: Behavior normal.      FHT Baseline 145, moderate variability, 15x15 accels, no decels Toco: none Cat: 1  Labs Results for orders placed or performed during the hospital encounter of 07/07/22 (from the past 24 hour(s))  Urinalysis, Routine w reflex microscopic Urine, Clean Catch     Status: None   Collection Time: 07/07/22  1:19 PM  Result Value Ref Range   Color, Urine YELLOW YELLOW   APPearance CLEAR CLEAR   Specific Gravity, Urine 1.013 1.005 - 1.030   pH 7.0 5.0 - 8.0   Glucose, UA NEGATIVE NEGATIVE mg/dL   Hgb urine dipstick NEGATIVE NEGATIVE   Bilirubin Urine NEGATIVE NEGATIVE   Ketones, ur NEGATIVE NEGATIVE mg/dL   Protein, ur NEGATIVE NEGATIVE mg/dL   Nitrite NEGATIVE NEGATIVE   Leukocytes,Ua NEGATIVE NEGATIVE  Protein / creatinine ratio, urine     Status: None   Collection Time: 07/07/22  1:27 PM  Result Value Ref Range   Creatinine, Urine 76 mg/dL   Total Protein, Urine 7 mg/dL    Protein Creatinine Ratio 0.09 0.00 - 0.15 mg/mg[Cre]  CBC     Status: Abnormal   Collection Time: 07/07/22  1:39 PM  Result Value Ref Range   WBC 6.5 4.0 - 10.5 K/uL   RBC 4.34 3.87 - 5.11 MIL/uL   Hemoglobin 10.7 (L) 12.0 - 15.0 g/dL   HCT 56.3 (L) 89.3 - 73.4 %   MCV 77.6 (L) 80.0 - 100.0 fL   MCH 24.7 (L) 26.0 - 34.0 pg   MCHC 31.8 30.0 - 36.0 g/dL   RDW 28.7 68.1 - 15.7 %   Platelets 272 150 - 400 K/uL   nRBC 0.0 0.0 - 0.2 %  Comprehensive metabolic panel     Status: Abnormal   Collection Time: 07/07/22  1:39 PM  Result Value Ref Range   Sodium 137 135 - 145 mmol/L   Potassium 3.6 3.5 - 5.1 mmol/L   Chloride 105 98 - 111 mmol/L   CO2 21 (L) 22 - 32 mmol/L   Glucose, Bld 119 (H) 70 - 99 mg/dL   BUN <5 (L) 6 - 20 mg/dL   Creatinine, Ser 2.62 0.44 - 1.00 mg/dL   Calcium 9.1 8.9 - 03.5 mg/dL   Total Protein 6.2 (L) 6.5 - 8.1 g/dL   Albumin 2.8 (L) 3.5 - 5.0 g/dL   AST 21 15 - 41 U/L   ALT 16 0 - 44 U/L   Alkaline Phosphatase 156 (H) 38 - 126 U/L   Total Bilirubin 0.2 (L) 0.3 - 1.2 mg/dL   GFR, Estimated >59 >74 mL/min   Anion gap 11 5 - 15    Imaging No results found.  MAU Course  Procedures Lab Orders         Urinalysis, Routine w reflex microscopic Urine, Clean Catch         CBC         Comprehensive metabolic panel         Protein / creatinine ratio, urine    No orders of the defined types were placed in this encounter.  Imaging Orders  No imaging studies ordered today    MDM Elevated BP in the office this morning x 1 & on intake in MAU - not 4 hours apart &  BP not consistent elevated. Does not meet criteria for gestational hypertension at this time. Preeclampsia labs normal.  Will have her f/u in the office on Monday for a BP check.  Assessment and Plan   1. Elevated BP without diagnosis of hypertension   2. [redacted] weeks gestation of pregnancy    -Message to office for BP check on Monday -Reviewed preeclampsia precautions & reasons to return to  [place], NP 07/07/22 7:39 PM

## 2022-07-07 NOTE — Progress Notes (Deleted)
   PRENATAL VISIT NOTE  Subjective:  Angela Lozano is a 26 y.o. G2P1001 at [redacted]w[redacted]d being seen today for ongoing prenatal care.  She is currently monitored for the following issues for this {Blank single:19197::"high-risk","low-risk"} pregnancy and has Supervision of high risk pregnancy, antepartum; History of pre-eclampsia; BMI 37.0-37.9, adult; and Obesity affecting pregnancy on their problem list.  Patient reports {sx:14538}.  Contractions: Not present. Vag. Bleeding: None.  Movement: Present. Denies leaking of fluid.   The following portions of the patient's history were reviewed and updated as appropriate: allergies, current medications, past family history, past medical history, past social history, past surgical history and problem list.   Objective:   Vitals:   07/07/22 1056  BP: (!) 143/88  Pulse: 95  Weight: 215 lb 12.8 oz (97.9 kg)    Fetal Status:     Movement: Present     General:  Alert, oriented and cooperative. Patient is in no acute distress.  Skin: Skin is warm and dry. No rash noted.   Cardiovascular: Normal heart rate noted  Respiratory: Normal respiratory effort, no problems with respiration noted  Abdomen: Soft, gravid, appropriate for gestational age.  Pain/Pressure: Absent     Pelvic: {Blank single:19197::"Cervical exam performed in the presence of a chaperone","Cervical exam deferred"}        Extremities: Normal range of motion.  Edema: Trace  Mental Status: Normal mood and affect. Normal behavior. Normal judgment and thought content.   Assessment and Plan:  Pregnancy: G2P1001 at [redacted]w[redacted]d There are no diagnoses linked to this encounter. {Blank single:19197::"Term","Preterm"} labor symptoms and general obstetric precautions including but not limited to vaginal bleeding, contractions, leaking of fluid and fetal movement were reviewed in detail with the patient. Please refer to After Visit Summary for other counseling recommendations.   No follow-ups on  file.  Future Appointments  Date Time Provider [place]  07/14/2022 10:15 AM Megan Salon, MD [facility]  07/21/2022 10:15 AM Megan Salon, MD [facility]  07/31/2022 10:45 AM Megan Salon, MD [facility]    Blenda Nicely, RN

## 2022-07-07 NOTE — Progress Notes (Signed)
   PRENATAL VISIT NOTE  Subjective:  Angela Lozano is a 26 y.o. G2P1001 at [redacted]w[redacted]d being seen today for ongoing prenatal care.  She is currently monitored for the following issues for this high-risk pregnancy and has Supervision of high risk pregnancy, antepartum; History of gestational hypertension; BMI 37.0-37.9, adult; and Obesity affecting pregnancy on their problem list.  Patient reports blurry vision today.  Reports she just doesn't feel great.  Has started having some feet swelling as well.  Shoes are tighter.   Contractions: Not present. Vag. Bleeding: None.  Movement: Present. Denies leaking of fluid.   The following portions of the patient's history were reviewed and updated as appropriate: allergies, current medications, past family history, past medical history, past social history, past surgical history and problem list.   Objective:   Vitals:   07/07/22 1056  BP: (!) 143/88  Pulse: 95  Weight: 215 lb 12.8 oz (97.9 kg)    Fetal Status: Fetal Heart Rate (bpm): 146   Movement: Present  Presentation: Vertex  General:  Alert, oriented and cooperative. Patient is in no acute distress.  Skin: Skin is warm and dry. No rash noted.   Cardiovascular: Normal heart rate noted  Respiratory: Normal respiratory effort, no problems with respiration noted  Abdomen: Soft, gravid, appropriate for gestational age.  Pain/Pressure: Absent     Pelvic: Cervical exam performed in the presence of a chaperone Dilation: 1.5 Effacement (%): 40 Station: Ballotable  Extremities: Normal range of motion.  Edema: Trace  Mental Status: Normal mood and affect. Normal behavior. Normal judgment and thought content.   Assessment and Plan:  Pregnancy: G2P1001 at [redacted]w[redacted]d 1. Supervision of high risk pregnancy, antepartum - on PNV and baby ASA  2. [redacted] weeks gestation of pregnancy  3. History of gestational hypertension  4. BMI 37.0-37.9, adult - last growth scan 10/31 at 79%-tile, 3243gm  5. Other  obesity affecting pregnancy in third trimester  6.  Elevated blood pressure - as is Friday, will sent to MAU for additional evaluation and PIH evaluation as well as for fetal monitoring.  Has spoke with Dr. Si Raider.  Pt aware that she may not be admitted for induction today and may need blood pressure follow up next week.  Pt voiced understanding.  Preterm labor symptoms and general obstetric precautions including but not limited to vaginal bleeding, contractions, leaking of fluid and fetal movement were reviewed in detail with the patient. Please refer to After Visit Summary for other counseling recommendations.   F/u planning will be done after MAU evaluation.  Future Appointments  Date Time Provider [place]  07/14/2022 10:15 AM Megan Salon, MD [facility]  07/21/2022 10:15 AM Megan Salon, MD [facility]  07/31/2022 10:45 AM Megan Salon, MD [facility]    Megan Salon, MD

## 2022-07-12 ENCOUNTER — Encounter (HOSPITAL_COMMUNITY): Payer: Self-pay | Admitting: Obstetrics & Gynecology

## 2022-07-12 ENCOUNTER — Inpatient Hospital Stay (HOSPITAL_COMMUNITY)
Admission: AD | Admit: 2022-07-12 | Discharge: 2022-07-14 | DRG: 807 | Disposition: A | Discharge status: Home / Self Care | Payer: Medicaid Other | Attending: Obstetrics and Gynecology | Admitting: Obstetrics and Gynecology

## 2022-07-12 ENCOUNTER — Ambulatory Visit (INDEPENDENT_AMBULATORY_CARE_PROVIDER_SITE_OTHER): Payer: Medicaid Other | Admitting: *Deleted

## 2022-07-12 VITALS — BP 154/96 | HR 103 | Wt 215.6 lb

## 2022-07-12 DIAGNOSIS — O99214 Obesity complicating childbirth: Secondary | ICD-10-CM | POA: Diagnosis present

## 2022-07-12 DIAGNOSIS — O0993 Supervision of high risk pregnancy, unspecified, third trimester: Secondary | ICD-10-CM | POA: Diagnosis not present

## 2022-07-12 DIAGNOSIS — O99213 Obesity complicating pregnancy, third trimester: Secondary | ICD-10-CM

## 2022-07-12 DIAGNOSIS — Z8759 Personal history of other complications of pregnancy, childbirth and the puerperium: Secondary | ICD-10-CM | POA: Diagnosis present

## 2022-07-12 DIAGNOSIS — O139 Gestational [pregnancy-induced] hypertension without significant proteinuria, unspecified trimester: Secondary | ICD-10-CM | POA: Diagnosis present

## 2022-07-12 DIAGNOSIS — Z8249 Family history of ischemic heart disease and other diseases of the circulatory system: Secondary | ICD-10-CM | POA: Diagnosis not present

## 2022-07-12 DIAGNOSIS — O0990 Supervision of high risk pregnancy, unspecified, unspecified trimester: Principal | ICD-10-CM

## 2022-07-12 DIAGNOSIS — O133 Gestational [pregnancy-induced] hypertension without significant proteinuria, third trimester: Secondary | ICD-10-CM

## 2022-07-12 DIAGNOSIS — Z3A37 37 weeks gestation of pregnancy: Secondary | ICD-10-CM

## 2022-07-12 DIAGNOSIS — O134 Gestational [pregnancy-induced] hypertension without significant proteinuria, complicating childbirth: Principal | ICD-10-CM | POA: Diagnosis present

## 2022-07-12 DIAGNOSIS — Z3490 Encounter for supervision of normal pregnancy, unspecified, unspecified trimester: Secondary | ICD-10-CM | POA: Insufficient documentation

## 2022-07-12 LAB — COMPREHENSIVE METABOLIC PANEL
ALT: 16 U/L (ref 0–44)
AST: 20 U/L (ref 15–41)
Albumin: 2.9 g/dL — ABNORMAL LOW (ref 3.5–5.0)
Alkaline Phosphatase: 172 U/L — ABNORMAL HIGH (ref 38–126)
Anion gap: 12 (ref 5–15)
BUN: <5 mg/dL — ABNORMAL LOW (ref 6–20)
CO2: 20 mmol/L — ABNORMAL LOW (ref 22–32)
Calcium: 8.8 mg/dL — ABNORMAL LOW (ref 8.9–10.3)
Chloride: 107 mmol/L (ref 98–111)
Creatinine, Ser: 0.48 mg/dL (ref 0.44–1.00)
GFR, Estimated: >60 mL/min (ref >60)
Glucose, Bld: 79 mg/dL (ref 70–99)
Potassium: 3.8 mmol/L (ref 3.5–5.1)
Sodium: 139 mmol/L (ref 135–145)
Total Bilirubin: 0.4 mg/dL (ref 0.3–1.2)
Total Protein: 6.5 g/dL (ref 6.5–8.1)

## 2022-07-12 LAB — CBC
HCT: 34.6 % — ABNORMAL LOW (ref 36.0–46.0)
Hemoglobin: 11.4 g/dL — ABNORMAL LOW (ref 12.0–15.0)
MCH: 25.2 pg — ABNORMAL LOW (ref 26.0–34.0)
MCHC: 32.9 g/dL (ref 30.0–36.0)
MCV: 76.4 fL — ABNORMAL LOW (ref 80.0–100.0)
Platelets: 244 10*3/uL (ref 150–400)
RBC: 4.53 MIL/uL (ref 3.87–5.11)
RDW: 14.6 % (ref 11.5–15.5)
WBC: 6.4 10*3/uL (ref 4.0–10.5)
nRBC: 0 % (ref 0.0–0.2)

## 2022-07-12 LAB — TYPE AND SCREEN
ABO/RH(D): A POS
Antibody Screen: NEGATIVE

## 2022-07-12 LAB — PROTEIN / CREATININE RATIO, URINE
Creatinine, Urine: 120 mg/dL
Protein Creatinine Ratio: 0.11 mg/mg{Cre} (ref 0.00–0.15)
Total Protein, Urine: 13 mg/dL

## 2022-07-12 MED ORDER — TERBUTALINE SULFATE 1 MG/ML IJ SOLN: 0.2500 mg | Freq: Once | INTRAMUSCULAR | Status: DC | PRN

## 2022-07-12 MED ORDER — OXYCODONE-ACETAMINOPHEN 5-325 MG PO TABS: 2.0000 | ORAL_TABLET | ORAL | Status: DC | PRN

## 2022-07-12 MED ORDER — LIDOCAINE HCL (PF) 1 % IJ SOLN: 30.0000 mL | INTRAMUSCULAR | Status: DC | PRN

## 2022-07-12 MED ORDER — SOD CITRATE-CITRIC ACID 500-334 MG/5ML PO SOLN: 30.0000 mL | ORAL | Status: DC | PRN

## 2022-07-12 MED ORDER — OXYTOCIN-SODIUM CHLORIDE 30-0.9 UT/500ML-% IV SOLN
1.0000 m[IU]/min | INTRAVENOUS | Status: DC
  Administered 2022-07-13: 2 m[IU]/min via INTRAVENOUS
  Filled 2022-07-12: qty 500

## 2022-07-12 MED ORDER — OXYTOCIN-SODIUM CHLORIDE 30-0.9 UT/500ML-% IV SOLN: 2.5000 [IU]/h | INTRAVENOUS | Status: DC

## 2022-07-12 MED ORDER — MISOPROSTOL 25 MCG QUARTER TABLET: 25.0000 ug | ORAL_TABLET | Freq: Once | ORAL | Status: DC

## 2022-07-12 MED ORDER — OXYTOCIN BOLUS FROM INFUSION
333.0000 mL | Freq: Once | INTRAVENOUS | Status: AC
  Administered 2022-07-13: 333 mL via INTRAVENOUS

## 2022-07-12 MED ORDER — LACTATED RINGERS IV SOLN: 500.0000 mL | INTRAVENOUS | Status: DC | PRN

## 2022-07-12 MED ORDER — MISOPROSTOL 25 MCG QUARTER TABLET
25.0000 ug | ORAL_TABLET | Freq: Once | ORAL | Status: AC
  Administered 2022-07-12: 25 ug via VAGINAL
  Filled 2022-07-12: qty 1

## 2022-07-12 MED ORDER — ACETAMINOPHEN 325 MG PO TABS: 650.0000 mg | ORAL_TABLET | ORAL | Status: DC | PRN

## 2022-07-12 MED ORDER — OXYCODONE-ACETAMINOPHEN 5-325 MG PO TABS: 1.0000 | ORAL_TABLET | ORAL | Status: DC | PRN

## 2022-07-12 MED ORDER — MISOPROSTOL 50MCG HALF TABLET: 50.0000 ug | ORAL_TABLET | Freq: Once | ORAL | Status: DC

## 2022-07-12 MED ORDER — LACTATED RINGERS IV SOLN: INTRAVENOUS | Status: DC

## 2022-07-12 MED ORDER — FENTANYL CITRATE (PF) 100 MCG/2ML IJ SOLN: 100.0000 ug | INTRAMUSCULAR | Status: DC | PRN

## 2022-07-12 MED ORDER — ONDANSETRON HCL 4 MG/2ML IJ SOLN: 4.0000 mg | Freq: Four times a day (QID) | INTRAMUSCULAR | Status: DC | PRN

## 2022-07-12 MED ORDER — MISOPROSTOL 50MCG HALF TABLET
50.0000 ug | ORAL_TABLET | Freq: Once | ORAL | Status: AC
  Administered 2022-07-12: 50 ug via ORAL
  Filled 2022-07-12: qty 1

## 2022-07-12 NOTE — Progress Notes (Addendum)
Pt in office for BP check following elevated BP last week and elevated reading in [employer]. Pt with complaints of ongoing blurry vision. Reports good fetal movement, denies contractions or vaginal bleeding. Pt placed on NST, strip reactive. Strip reviewed by Dr. Hyacinth Meeker.  Advised that attending provider, Dr. Para March notified and recommends delivery. hospital will be reaching out to her today to give her a time to come in for admission today. Pt aware to be on lookout for phone call.

## 2022-07-12 NOTE — Progress Notes (Signed)
Patient ID: Angela Lozano, female   DOB: 1996-05-18, 26 y.o.   MRN: [id number]  S/p double dose of cytotec; feeling some cramping; denies s/s pre-e  BPs 124/76, 116/74 EFM 130-140, +accels, no decels Ctx irreg Cx 1+/60/vtx -2  IUP@37 .5wks gHTN Cx unfavorable  -Cervical foley inserted without difficulty and inflated to 40cc -Plan for Pitocin when it comes out -Pre-e labs pending  [name] CNM 07/12/2022 8:04 PM

## 2022-07-12 NOTE — H&P (Signed)
OBSTETRIC ADMISSION HISTORY AND PHYSICAL  Angela Lozano is a 26 y.o. female G2P1001 with IUP at [redacted]w[redacted]d by 8w [place] presenting for IOL for gHTN. Pt was seen in the MAU on 07/07/22 with elevated BP and was again seen in clinic where she had another elevated BP >4hr from initial. She reports ongoing blurry vision which is somewhat baseline for patient. She reports +FMs, No LOF, no VB, no headaches or peripheral edema, and RUQ pain. She plans on breast and bottle feeding. She is undecided on birth control and will f/u at 6w postpartum visit.   She received her prenatal care at  [facility].    Dating: By Aviva Signs [place] --->  Estimated Date of Delivery: 07/28/22  Sono:   @[redacted]w[redacted]d , CWD, normal anatomy, cephalic presentation, 3243g, EFW  Prenatal History/Complications:  -History of gestational hypertension in G1 pregnancy: denies known mg or treatment -Pre-pregnancy BMI 37  Past Medical History: Past Medical History:  Diagnosis Date   Gallstone    History of gestational hypertension    Medical history non-contributory    Past Surgical History: Past Surgical History:  Procedure Laterality Date   CHOLECYSTECTOMY N/A 06/10/2021   Procedure: LAPAROSCOPIC CHOLECYSTECTOMY;  Surgeon: 08/10/2021, MD;  Location: [facility] OR;  Service: General;  Laterality: N/A;   Obstetrical History: OB History     Gravida  2   Para  1   Term  1   Preterm      AB      Living  1      SAB      IAB      Ectopic      Multiple  0   Live Births  1          Social History Social History   Socioeconomic History   Marital status: Married    Spouse name: Berna Bue   Number of children: Not on file   Years of education: Not on file   Highest education level: Not on file  Occupational History   Not on file  Tobacco Use   Smoking status: Never   Smokeless tobacco: Never  Vaping Use   Vaping Use: Never used  Substance and Sexual Activity   Alcohol use: Not Currently     Alcohol/week: 2.0 standard drinks of alcohol    Types: 2 Cans of beer per week   Drug use: Never   Sexual activity: Yes    Birth control/protection: None  Other Topics Concern   Not on file  Social History Narrative   Not on file   Social Determinants of Health   Financial Resource Strain: Low Risk  (12/29/2021)   Overall Financial Resource Strain (CARDIA)    Difficulty of Paying Living Expenses: Not hard at all  Food Insecurity: No Food Insecurity (07/12/2022)   Hunger Vital Sign    Worried About Running Out of Food in the Last Year: Never true    Ran Out of Food in the Last Year: Never true  Transportation Needs: No Transportation Needs (07/12/2022)   PRAPARE - 13/04/2022 (Medical): No    Lack of Transportation (Non-Medical): No  Physical Activity: Insufficiently Active (12/29/2021)   Exercise Vital Sign    Days of Exercise per Week: 3 days    Minutes of Exercise per Session: 30 min  Stress: No Stress Concern Present (12/29/2021)   12/31/2021 of Occupational Health - Occupational Stress Questionnaire    Feeling of Stress : Not  at all  Social Connections: Moderately Integrated (12/29/2021)   Social Connection and Isolation Panel [NHANES]    Frequency of Communication with Friends and Family: Twice a week    Frequency of Social Gatherings with Friends and Family: Once a week    Attends Religious Services: More than 4 times per year    Active Member of [employer] or Organizations: No    Attends Engineer, structural: Never    Marital Status: Married   Family History: Family History  Problem Relation Age of Onset   Miscarriages / Stillbirths Mother    Hypertension Father    Hyperlipidemia Father    Diabetes Maternal Grandmother    Hearing loss Maternal Grandfather    Allergies: No Known Allergies  Medications Prior to Admission  Medication Sig Dispense Refill Last Dose   aspirin EC 81 MG tablet Take 1 tablet (81 mg total) by mouth  daily. Swallow whole. 30 tablet 6    Prenatal Vit-Fe Fumarate-FA (MULTIVITAMIN-PRENATAL) 27-0.8 MG TABS tablet Take 1 tablet by mouth daily at 12 noon.      Review of Systems   All systems reviewed and negative except as stated in HPI  Blood pressure 132/80, pulse 71, temperature 97.6 F (36.4 C), temperature source Oral, resp. rate 16, height 5\' 1"  (1.549 m), weight 97.1 kg, last menstrual period 10/14/2021, unknown if currently breastfeeding. General appearance: alert, cooperative, and no distress Lungs: clear to auscultation bilaterally Heart: regular rate and rhythm Abdomen: soft, non-tender; bowel sounds normal Pelvic: adequate, proven to 5lb13oz Extremities: Homans sign is negative, no sign of DVT Presentation: cephalic Fetal monitoringBaseline: 150 bpm, Variability: moderate, Accelerations: none, and Decelerations: Absent Cat I Uterine activityFrequency: no regular contractions  Dilation: Closed Effacement (%): Thick Station: -3 Exam by:: [email or web address], RN   Prenatal labs: ABO, Rh: --/--/A POS (11/08 1435) Antibody: NEG (11/08 1435) Rubella: 1.09 (04/27 1056) RPR: Non Reactive (09/01 0846)  HBsAg: Negative (04/27 1056)  HIV: Non Reactive (09/01 0846)  GBS: Negative/-- (10/27 1039)  1 hr Glucola 116 Genetic screening negative Anatomy 06-24-1980 normal   Prenatal Transfer Tool  Maternal Diabetes: No Genetic Screening: Normal Maternal Ultrasounds/Referrals: Normal Fetal Ultrasounds or other Referrals:  None Maternal Substance Abuse:  No Significant Maternal Medications:  None Significant Maternal Lab Results:  Group B Strep negative Number of Prenatal Visits:greater than 3 verified prenatal visits Other Comments:  None  Results for orders placed or performed during the hospital encounter of 07/12/22 (from the past 24 hour(s))  CBC   Collection Time: 07/12/22  2:35 PM  Result Value Ref Range   WBC 6.4 4.0 - 10.5 K/uL   RBC 4.53 3.87 - 5.11 MIL/uL   Hemoglobin 11.4 (L)  12.0 - 15.0 g/dL   HCT 13/08/23 (L) 85.2 - 77.8 %   MCV 76.4 (L) 80.0 - 100.0 fL   MCH 25.2 (L) 26.0 - 34.0 pg   MCHC 32.9 30.0 - 36.0 g/dL   RDW 24.2 35.3 - 61.4 %   Platelets 244 150 - 400 K/uL   nRBC 0.0 0.0 - 0.2 %  Type and screen [facility]   Collection Time: 07/12/22  2:35 PM  Result Value Ref Range   ABO/RH(D) A POS    Antibody Screen NEG    Sample Expiration      07/15/2022,2359 Performed at [facility], [address], [place], [place]     Patient Active Problem List   Diagnosis Date Noted   Pregnancy 07/12/2022  Obesity affecting pregnancy 01/17/2022   Supervision of high risk pregnancy, antepartum 12/29/2021   History of gestational hypertension 12/29/2021   BMI 37.0-37.9, adult 12/29/2021   Assessment/Plan:  Kennia Vanvorst is a 26 y.o. G2P1001 at [redacted]w[redacted]d here for IOL for gHTN.  #Labor: not in labor, here for IOL. Plan for vaginal cytotec. Discussed progression to pitocin/AROM which patient is agreeable to when the time comes.  #gHTN: labs pending. Blurry vision but otherwise asymptomatic. Plan to monitor blood pressures closely.  #Pain: Plan for epidural  #FWB: Cat I  #ID:  GBS negative #MOF: Breast and Bottle #MOC: Undecided, would like to discuss at [facility] visit   Welcoming baby girl [place]!   Theo Dills, MD  07/12/2022, 5:15 PM

## 2022-07-13 ENCOUNTER — Inpatient Hospital Stay (HOSPITAL_COMMUNITY): Payer: Medicaid Other | Admitting: Anesthesiology

## 2022-07-13 ENCOUNTER — Encounter (HOSPITAL_COMMUNITY): Payer: Self-pay | Admitting: Obstetrics & Gynecology

## 2022-07-13 DIAGNOSIS — O134 Gestational [pregnancy-induced] hypertension without significant proteinuria, complicating childbirth: Secondary | ICD-10-CM

## 2022-07-13 DIAGNOSIS — Z3A37 37 weeks gestation of pregnancy: Secondary | ICD-10-CM

## 2022-07-13 LAB — CBC
HCT: 33.5 % — ABNORMAL LOW (ref 36.0–46.0)
HCT: 34 % — ABNORMAL LOW (ref 36.0–46.0)
Hemoglobin: 10.9 g/dL — ABNORMAL LOW (ref 12.0–15.0)
Hemoglobin: 10.9 g/dL — ABNORMAL LOW (ref 12.0–15.0)
MCH: 24.6 pg — ABNORMAL LOW (ref 26.0–34.0)
MCH: 24.8 pg — ABNORMAL LOW (ref 26.0–34.0)
MCHC: 32.1 g/dL (ref 30.0–36.0)
MCHC: 32.5 g/dL (ref 30.0–36.0)
MCV: 75.6 fL — ABNORMAL LOW (ref 80.0–100.0)
MCV: 77.4 fL — ABNORMAL LOW (ref 80.0–100.0)
Platelets: 226 10*3/uL (ref 150–400)
Platelets: 233 10*3/uL (ref 150–400)
RBC: 4.39 MIL/uL (ref 3.87–5.11)
RBC: 4.43 MIL/uL (ref 3.87–5.11)
RDW: 14.6 % (ref 11.5–15.5)
RDW: 14.6 % (ref 11.5–15.5)
WBC: 10 10*3/uL (ref 4.0–10.5)
WBC: 8.6 10*3/uL (ref 4.0–10.5)
nRBC: 0 % (ref 0.0–0.2)
nRBC: 0 % (ref 0.0–0.2)

## 2022-07-13 LAB — RPR: RPR Ser Ql: NONREACTIVE

## 2022-07-13 MED ORDER — BENZOCAINE-MENTHOL 20-0.5 % EX AERO
1.0000 | INHALATION_SPRAY | CUTANEOUS | Status: DC | PRN
  Administered 2022-07-13: 1 via TOPICAL

## 2022-07-13 MED ORDER — DIPHENHYDRAMINE HCL 25 MG PO CAPS: 25.0000 mg | ORAL_CAPSULE | Freq: Four times a day (QID) | ORAL | Status: DC | PRN

## 2022-07-13 MED ORDER — DIBUCAINE (PERIANAL) 1 % EX OINT: 1.0000 | TOPICAL_OINTMENT | CUTANEOUS | Status: DC | PRN

## 2022-07-13 MED ORDER — COCONUT OIL OIL: 1.0000 | TOPICAL_OIL | Status: DC | PRN

## 2022-07-13 MED ORDER — SIMETHICONE 80 MG PO CHEW: 80.0000 mg | CHEWABLE_TABLET | ORAL | Status: DC | PRN

## 2022-07-13 MED ORDER — FENTANYL-BUPIVACAINE-NACL 0.5-0.125-0.9 MG/250ML-% EP SOLN
12.0000 mL/h | EPIDURAL | Status: DC | PRN
  Filled 2022-07-13: qty 250

## 2022-07-13 MED ORDER — ZOLPIDEM TARTRATE 5 MG PO TABS: 5.0000 mg | ORAL_TABLET | Freq: Every evening | ORAL | Status: DC | PRN

## 2022-07-13 MED ORDER — PHENYLEPHRINE 80 MCG/ML (10ML) SYRINGE FOR IV PUSH (FOR BLOOD PRESSURE SUPPORT): 80.0000 ug | PREFILLED_SYRINGE | INTRAVENOUS | Status: DC | PRN

## 2022-07-13 MED ORDER — WITCH HAZEL-GLYCERIN EX PADS: 1.0000 | MEDICATED_PAD | CUTANEOUS | Status: DC | PRN

## 2022-07-13 MED ORDER — PRENATAL MULTIVITAMIN CH
1.0000 | ORAL_TABLET | Freq: Every day | ORAL | Status: DC
  Administered 2022-07-13 – 2022-07-14 (×2): 1 via ORAL
  Filled 2022-07-13 (×2): qty 1

## 2022-07-13 MED ORDER — SENNOSIDES-DOCUSATE SODIUM 8.6-50 MG PO TABS
2.0000 | ORAL_TABLET | ORAL | Status: DC
  Administered 2022-07-13 – 2022-07-14 (×2): 2 via ORAL
  Filled 2022-07-13 (×2): qty 2

## 2022-07-13 MED ORDER — ONDANSETRON HCL 4 MG/2ML IJ SOLN: 4.0000 mg | INTRAMUSCULAR | Status: DC | PRN

## 2022-07-13 MED ORDER — EPHEDRINE 5 MG/ML INJ: 10.0000 mg | INTRAVENOUS | Status: DC | PRN

## 2022-07-13 MED ORDER — DIPHENHYDRAMINE HCL 50 MG/ML IJ SOLN: 12.5000 mg | INTRAMUSCULAR | Status: DC | PRN

## 2022-07-13 MED ORDER — MEASLES, MUMPS & RUBELLA VAC IJ SOLR: 0.5000 mL | Freq: Once | INTRAMUSCULAR | Status: DC

## 2022-07-13 MED ORDER — LIDOCAINE HCL (PF) 1 % IJ SOLN
INTRAMUSCULAR | Status: DC | PRN
  Administered 2022-07-13: 5 mL via EPIDURAL

## 2022-07-13 MED ORDER — OXYCODONE HCL 5 MG PO TABS: 5.0000 mg | ORAL_TABLET | ORAL | Status: DC | PRN

## 2022-07-13 MED ORDER — ACETAMINOPHEN 325 MG PO TABS: 650.0000 mg | ORAL_TABLET | ORAL | Status: DC | PRN

## 2022-07-13 MED ORDER — TETANUS-DIPHTH-ACELL PERTUSSIS 5-2.5-18.5 LF-MCG/0.5 IM SUSY: 0.5000 mL | PREFILLED_SYRINGE | Freq: Once | INTRAMUSCULAR | Status: DC

## 2022-07-13 MED ORDER — LACTATED RINGERS IV SOLN
500.0000 mL | Freq: Once | INTRAVENOUS | Status: AC
  Administered 2022-07-13: 500 mL via INTRAVENOUS

## 2022-07-13 MED ORDER — FENTANYL-BUPIVACAINE-NACL 0.5-0.125-0.9 MG/250ML-% EP SOLN
EPIDURAL | Status: DC | PRN
  Administered 2022-07-13: 12 mL/h via EPIDURAL

## 2022-07-13 MED ORDER — ONDANSETRON HCL 4 MG PO TABS: 4.0000 mg | ORAL_TABLET | ORAL | Status: DC | PRN

## 2022-07-13 MED ORDER — IBUPROFEN 600 MG PO TABS
600.0000 mg | ORAL_TABLET | Freq: Four times a day (QID) | ORAL | Status: DC
  Administered 2022-07-13 – 2022-07-14 (×5): 600 mg via ORAL
  Filled 2022-07-13 (×5): qty 1

## 2022-07-13 NOTE — Anesthesia Procedure Notes (Signed)
Epidural Patient location during procedure: OB Start time: 07/13/2022 3:15 AM End time: 07/13/2022 3:30 AM  Staffing Anesthesiologist: Trevor Iha, MD Performed: anesthesiologist   Preanesthetic Checklist Completed: patient identified, IV checked, site marked, risks and benefits discussed, surgical consent, monitors and equipment checked, pre-op evaluation and timeout performed  Epidural Patient position: sitting Prep: DuraPrep and site prepped and draped Patient monitoring: continuous pulse ox and blood pressure Approach: midline Location: L3-L4 Injection technique: LOR air  Needle:  Needle type: Tuohy  Needle gauge: 17 G Needle length: 9 cm and 9 Needle insertion depth: 7 cm Catheter type: closed end flexible Catheter size: 19 Gauge Catheter at skin depth: 13 cm Test dose: negative  Assessment Events: blood not aspirated, injection not painful, no injection resistance, no paresthesia and negative IV test  Additional Notes Patient identified. Risks/Benefits/Options discussed with patient including but not limited to bleeding, infection, nerve damage, paralysis, failed block, incomplete pain control, headache, blood pressure changes, nausea, vomiting, reactions to medication both or allergic, itching and postpartum back pain. Confirmed with bedside nurse the patient's most recent platelet count. Confirmed with patient that they are not currently taking any anticoagulation, have any bleeding history or any family history of bleeding disorders. Patient expressed understanding and wished to proceed. All questions were answered. Sterile technique was used throughout the entire procedure. Please see nursing notes for vital signs. Test dose was given through epidural needle and negative prior to continuing to dose epidural or start infusion. Warning signs of high block given to the patient including shortness of breath, tingling/numbness in hands, complete motor block, or any  concerning symptoms with instructions to call for help. Patient was given instructions on fall risk and not to get out of bed. All questions and concerns addressed with instructions to call with any issues.  1 Attempt (S) . Patient tolerated procedure well.

## 2022-07-13 NOTE — Anesthesia Preprocedure Evaluation (Signed)
Anesthesia Evaluation  Patient identified by MRN, date of birth, ID band Patient awake    Reviewed: Allergy & Precautions, NPO status , Patient's Chart, lab work & pertinent test results  Airway Mallampati: III  TM Distance: >3 FB Neck ROM: Full    Dental no notable dental hx. (+) Teeth Intact, Dental Advisory Given   Pulmonary neg pulmonary ROS   Pulmonary exam normal breath sounds clear to auscultation       Cardiovascular hypertension (gHtn), Normal cardiovascular exam Rhythm:Regular Rate:Normal     Neuro/Psych negative neurological ROS  negative psych ROS   GI/Hepatic negative GI ROS, Neg liver ROS,,,  Endo/Other  negative endocrine ROS    Renal/GU negative Renal ROS     Musculoskeletal   Abdominal  (+) + obese  Peds  Hematology Lab Results      Component                Value               Date                      WBC                      8.6                 07/13/2022                HGB                      10.9 (L)            07/13/2022                HCT                      33.5 (L)            07/13/2022                MCV                      75.6 (L)            07/13/2022                PLT                      226                 07/13/2022              Anesthesia Other Findings   Reproductive/Obstetrics (+) Pregnancy                             Anesthesia Physical Anesthesia Plan  ASA: 3  Anesthesia Plan: Epidural   Post-op Pain Management:    Induction:   PONV Risk Score and Plan:   Airway Management Planned:   Additional Equipment:   Intra-op Plan:   Post-operative Plan:   Informed Consent: I have reviewed the patients History and Physical, chart, labs and discussed the procedure including the risks, benefits and alternatives for the proposed anesthesia with the patient or authorized representative who has indicated his/her understanding and acceptance.        Plan Discussed with:   Anesthesia Plan Comments: (37.6wk G2P1  w gHTN  anb BMI of 40.5 for LEA)       Anesthesia Quick Evaluation

## 2022-07-13 NOTE — Anesthesia Postprocedure Evaluation (Signed)
Anesthesia Post Note  Patient: Angela Lozano  Procedure(s) Performed: AN AD HOC LABOR EPIDURAL     Patient location during evaluation: Mother Baby Anesthesia Type: Epidural Level of consciousness: awake and alert and oriented Pain management: satisfactory to patient Vital Signs Assessment: post-procedure vital signs reviewed and stable Respiratory status: respiratory function stable Cardiovascular status: stable Postop Assessment: no headache, no backache, epidural receding, patient able to bend at knees, no signs of nausea or vomiting, adequate PO intake and able to ambulate Anesthetic complications: no   No notable events documented.  Last Vitals:  Vitals:   07/13/22 0900 07/13/22 1300  BP: 104/60 133/84  Pulse: 79 (!) 102  Resp: 16 16  Temp: 36.5 C 36.7 C  SpO2: 100% 98%    Last Pain:  Vitals:   07/13/22 1300  TempSrc: Oral  PainSc:    Pain Goal:                   Angela Lozano

## 2022-07-13 NOTE — Lactation Note (Signed)
This note was copied from a baby's chart. Lactation Consultation Note  Patient Name: Angela Lozano Today's Date: 07/13/2022 Reason for consult: Initial assessment;Early term 37-38.6wks Age:25 hours   Parent has 23 month old that did not latch, but parent pumped for 3 months. Low milk supply. Desires to BF. Parent collected 7ml colostrum into spoon.  LC demonstrated feeding with curved tip syringe.   Parent has been unable to latch baby to the right.  LC encouraged pillows for added support.  Mom attempted to latch in cradle.  Baby not extending tongue to latch.  [facility] taught parent to sandwich tissue/ asymmetric latch, to help baby grasp breast easier.   Baby latched but after sucking moved to more pursed lips and a more shallow latch.   Top curved inward with latching bottom lip needed flanging.   Parent felt pinching.   Adjustments made and LC assisted with latching in different position for more comfort.  Upon oral assessment; baby has two raised nodules errupting on gumline; center mandible. RN aware as well.    Parent encouraged to continue hand expression to feed back milk to baby.   Discussed BF Basics 8-12 feeds in 24, cues, and feeding back any expressed milk to baby.  Resource sheet given.  Parent encouraged to reach out for OP LC support with [facility].      Maternal Data Has patient been taught Hand Expression?: Yes Does the patient have breastfeeding experience prior to this delivery?: Yes How long did the patient breastfeed?: 3 months; mom pumped, baby did not latch.  Feeding Mother's Current Feeding Choice: Breast Milk  LATCH Score Latch: Repeated attempts needed to sustain latch, nipple held in mouth throughout feeding, stimulation needed to elicit sucking reflex.  Audible Swallowing: Spontaneous and intermittent  Type of Nipple: Flat (easily compressible tissue)  Comfort (Breast/Nipple): Filling, red/small blisters or bruises, mild/mod  discomfort (pinching with latch)  Hold (Positioning): Assistance needed to correctly position infant at breast and maintain latch.  LATCH Score: 6   Lactation Tools Discussed/Used    Interventions Interventions: Breast feeding basics reviewed;Assisted with latch;Skin to skin;Support pillows;Adjust position;Position options;Expressed milk;LC Services brochure  Discharge Discharge Education: Outpatient recommendation (recomm. Noralee Stain for OP) Pump: Personal (motif and mom cozy)  Consult Status Consult Status: Follow-up Date: 07/14/22 Follow-up type: In-patient    Maryruth Hancock [facility] 07/13/2022, 10:07 AM

## 2022-07-13 NOTE — Discharge Summary (Signed)
Postpartum Discharge Summary     Patient Name: Angela Lozano DOB: 1996/03/20 MRN: [id number]  Date of admission: 07/12/2022 Delivery date:07/13/2022  Delivering provider: Serita Grammes D  Date of discharge: 07/14/2022  Admitting diagnosis: Pregnancy [Z34.90] Intrauterine pregnancy: [redacted]w[redacted]d    Secondary diagnosis:  Active Problems:   Gestational hypertension  Additional problems: none    Discharge diagnosis: Term Pregnancy Delivered and Gestational Hypertension                                              Post partum procedures: none Augmentation: Pitocin, Cytotec, and IP Foley Complications: None  Hospital course: Induction of Labor With Vaginal Delivery   26y.o. yo G2P1001 at 337w6das admitted to the hospital 07/12/2022 for induction of labor.  Indication for induction: Gestational hypertension.  Patient had an uncomplicated labor course with neg pre-e labs and remaining asymptomatic for pre-e. Membrane Rupture Time/Date: 3:00 AM ,07/13/2022   Delivery Method:Vaginal, Spontaneous  Episiotomy: None  Lacerations:  None  Details of delivery can be found in separate delivery note.  Patient had a postpartum course complicated by history of gHTN, BP was WNL but patient was started on babyscripts for monitoring of BP outpatient. She has BP cuff at home. Patient is discharged home 07/14/22.  Newborn Data: Birth date:07/13/2022  Birth time:5:43 AM  Gender:Female  Living status:Living  Apgars:9 ,9  Weight:3090 g (6lb 13oz)  Magnesium Sulfate received: No BMZ received: No Rhophylac:N/A MMR:N/A T-DaP:Given prenatally Flu: No Transfusion:No  Physical exam  Vitals:   07/13/22 1300 07/13/22 1730 07/13/22 2137 07/14/22 0645  BP: 133/84 115/78 108/77 102/61  Pulse: (!) 102 68 77 76  Resp: _0 Temp: 98 F (36.7 C) 98.3 F (36.8 C) 97.7 F (36.5 C) (!) 97.5 F (36.4 C)  TempSrc: Oral Oral Oral Oral  SpO2: 98% 100% 99% 100%  Weight:      Height:        General: alert, cooperative, and no distress Lochia: appropriate Uterine Fundus: firm Incision: N/A DVT Evaluation: No evidence of DVT seen on physical exam. Labs: Lab Results  Component Value Date   WBC 10.0 07/13/2022   HGB 10.9 (L) 07/13/2022   HCT 34.0 (L) 07/13/2022   MCV 77.4 (L) 07/13/2022   PLT 233 07/13/2022      Latest Ref Rng & Units 07/12/2022    6:10 PM  CMP  Glucose 70 - 99 mg/dL 79   BUN 6 - 20 mg/dL <5   Creatinine 0.44 - 1.00 mg/dL 0.48   Sodium 135 - 145 mmol/L 139   Potassium 3.5 - 5.1 mmol/L 3.8   Chloride 98 - 111 mmol/L 107   CO2 22 - 32 mmol/L 20   Calcium 8.9 - 10.3 mg/dL 8.8   Total Protein 6.5 - 8.1 g/dL 6.5   Total Bilirubin 0.3 - 1.2 mg/dL 0.4   Alkaline Phos 38 - 126 U/L 172   AST 15 - 41 U/L 20   ALT 0 - 44 U/L 16    Edinburgh Score:    07/13/2022    5:36 PM  Edinburgh Postnatal Depression Scale Screening Tool  I have been able to laugh and see the funny side of things. 0  I have looked forward with enjoyment to things. 0  I have blamed myself unnecessarily when things went wrong. 0  I have been anxious or worried for no good reason. 2  I have felt scared or panicky for no good reason. 0  Things have been getting on top of me. 0  I have been so unhappy that I have had difficulty sleeping. 0  I have felt sad or miserable. 0  I have been so unhappy that I have been crying. 0  The thought of harming myself has occurred to me. 0  Edinburgh Postnatal Depression Scale Total 2     After visit meds:  Allergies as of 07/14/2022   No Known Allergies      Medication List     STOP taking these medications    aspirin EC 81 MG tablet       TAKE these medications    acetaminophen 325 MG tablet Commonly known as: Tylenol Take 2 tablets (650 mg total) by mouth every 4 (four) hours as needed (for pain scale < 4).   ibuprofen 600 MG tablet Commonly known as: ADVIL Take 1 tablet (600 mg total) by mouth every 6 (six) hours.    multivitamin-prenatal 27-0.8 MG Tabs tablet Take 1 tablet by mouth daily at 12 noon.         Discharge home in stable condition Infant Feeding: Bottle and Breast Infant Disposition:home with mother Discharge instruction: per After Visit Summary and Postpartum booklet. Activity: Advance as tolerated. Pelvic rest for 6 weeks.  Diet: routine diet Future Appointments: Future Appointments  Date Time Provider [place]  07/24/2022 10:30 AM [facility]YN NURSE [facility]  08/21/2022 10:40 AM Luvenia Redden, PA-C [facility]   Follow up Visit:  Myrtis Ser, CNM  P [facility] Admin Please schedule this patient for Postpartum visit in: 6 weeks with the following provider: Any provider In-Person For C/S patients schedule nurse incision check in weeks 2 weeks: no High risk pregnancy complicated by: gHTN Delivery mode:  SVD Anticipated Birth Control:  IUD PP Procedures needed: BP check in 1 wk (RN visit) Flavia Shipper: unsure Schedule Integrated BH visit: no No relevant baby issues   07/14/2022 Caren Macadam, MD

## 2022-07-13 NOTE — Progress Notes (Signed)
Patient ID: Angela Lozano, female   DOB: 07-15-1996, 26 y.o.   MRN: [id number]  S/p cervical foley and a double dose of 50/25 cytotec; Pit started at 0015; doing well with some cramping  BP 116/73, 132/88 (all nl BPs on L&D so far) EFM 120-130s, +accels, no decels Ctx q 2-5 mins with Pit at 14mu/min Cx was 4 (inner os)/60/vtx -2 w start of Pit  IUP@37 .6wks gHTN IOL process  Continue uptitrating Pit to achieve adequate labor Anticipate vag del  [name] [facility] 07/13/2022 2:23 AM

## 2022-07-14 ENCOUNTER — Encounter (HOSPITAL_BASED_OUTPATIENT_CLINIC_OR_DEPARTMENT_OTHER): Payer: 59 | Admitting: Obstetrics & Gynecology

## 2022-07-14 MED ORDER — ACETAMINOPHEN 325 MG PO TABS: 650.0000 mg | ORAL_TABLET | ORAL | 1 refills | Status: DC | PRN

## 2022-07-14 MED ORDER — IBUPROFEN 600 MG PO TABS: 600.0000 mg | ORAL_TABLET | Freq: Four times a day (QID) | ORAL | 0 refills | Status: DC

## 2022-07-14 NOTE — Progress Notes (Signed)
POSTPARTUM PROGRESS NOTE  Post Partum Day 1  Subjective:  Angela Lozano is a 26 y.o. G6Y4034 s/p SVD at [redacted]w[redacted]d.  She reports she is doing well. No acute events overnight. She denies any problems with ambulating, voiding or po intake. Denies nausea or vomiting.  Pain is well controlled.  Lochia is small.  Objective: Blood pressure 102/61, pulse 76, temperature (!) 97.5 F (36.4 C), temperature source Oral, resp. rate 18, height 5\' 1"  (1.549 m), weight 97.1 kg, last menstrual period 10/14/2021, SpO2 100 %, unknown if currently breastfeeding.  Physical Exam:  General: alert, cooperative and no distress Chest: no respiratory distress Heart:regular rate, distal pulses intact Abdomen: soft, nontender,  Uterine Fundus: firm, appropriately tender DVT Evaluation: No calf swelling or tenderness Extremities: No edema Skin: warm, dry  Recent Labs    07/13/22 0034 07/13/22 0748  HGB 10.9* 10.9*  HCT 33.5* 34.0*    Assessment/Plan: Angela Lozano is a 26 y.o. 22 s/p SVD at [redacted]w[redacted]d   PPD#1 - Doing well Routine postpartum care Contraception: Outpatient IUD Feeding: Breast and bottle feeding Dispo: Plan for discharge tomorrow.   LOS: 2 days   [redacted]w[redacted]d, SNM 07/14/2022, 7:53 AM

## 2022-07-14 NOTE — Progress Notes (Signed)
Patient already signed up for Babyscripts patient given information on how often to plug her BPs in when she goes home.

## 2022-07-21 ENCOUNTER — Encounter (HOSPITAL_BASED_OUTPATIENT_CLINIC_OR_DEPARTMENT_OTHER): Payer: 59 | Admitting: Obstetrics & Gynecology

## 2022-07-22 ENCOUNTER — Telehealth (HOSPITAL_COMMUNITY): Payer: Self-pay

## 2022-07-22 NOTE — Telephone Encounter (Signed)
Patient did not answer phone call. Voicemail left for patient.   Angela Lozano [facility] 07/22/2022,1314

## 2022-07-24 ENCOUNTER — Encounter (HOSPITAL_BASED_OUTPATIENT_CLINIC_OR_DEPARTMENT_OTHER): Payer: Self-pay

## 2022-07-24 ENCOUNTER — Ambulatory Visit (INDEPENDENT_AMBULATORY_CARE_PROVIDER_SITE_OTHER): Payer: Medicaid Other

## 2022-07-24 DIAGNOSIS — Z0130 Encounter for examination of blood pressure without abnormal findings: Secondary | ICD-10-CM

## 2022-07-24 NOTE — Progress Notes (Signed)
Patient came in today to recheck blood pressure. tbw

## 2022-07-31 ENCOUNTER — Encounter (HOSPITAL_BASED_OUTPATIENT_CLINIC_OR_DEPARTMENT_OTHER): Payer: 59 | Admitting: Obstetrics & Gynecology

## 2022-08-21 ENCOUNTER — Ambulatory Visit (INDEPENDENT_AMBULATORY_CARE_PROVIDER_SITE_OTHER): Payer: Medicaid Other | Admitting: Medical

## 2022-08-21 ENCOUNTER — Encounter (HOSPITAL_BASED_OUTPATIENT_CLINIC_OR_DEPARTMENT_OTHER): Payer: Self-pay | Admitting: Medical

## 2022-08-21 DIAGNOSIS — Z30430 Encounter for insertion of intrauterine contraceptive device: Secondary | ICD-10-CM

## 2022-08-21 DIAGNOSIS — Z8759 Personal history of other complications of pregnancy, childbirth and the puerperium: Secondary | ICD-10-CM | POA: Diagnosis not present

## 2022-08-21 DIAGNOSIS — Z124 Encounter for screening for malignant neoplasm of cervix: Secondary | ICD-10-CM

## 2022-08-21 MED ORDER — LEVONORGESTREL 20 MCG/DAY IU IUD
1.0000 | INTRAUTERINE_SYSTEM | Freq: Once | INTRAUTERINE | Status: AC
  Administered 2022-08-21: 1 via INTRAUTERINE

## 2022-08-21 NOTE — Progress Notes (Signed)
Post Partum Visit Note  Angela Lozano is a 26 y.o. G50P2002 female who presents for a postpartum visit. She is 5 weeks postpartum following a normal spontaneous vaginal delivery.  I have fully reviewed the prenatal and intrapartum course. The delivery was at 37.5 gestational weeks.  Anesthesia: epidural. Postpartum course has been uncomplicated. Baby is doing well. Baby is feeding by breast. Bleeding staining only. Bowel function is abnormal: constipation . Bladder function is normal. Patient is not sexually active. Contraception method is abstinence. Postpartum depression screening: negative.   The pregnancy intention screening data noted above was reviewed. Potential methods of contraception were discussed. The patient elected to proceed with No data recorded.    Health Maintenance Due  Topic Date Due   Medicare Annual Wellness (AWV)  Never done   COVID-19 Vaccine (1) Never done   HPV VACCINES (1 - 2-dose series) Never done   PAP-Cervical Cytology Screening  Never done   PAP SMEAR-Modifier  Never done   INFLUENZA VACCINE  04/04/2022    The following portions of the patient's history were reviewed and updated as appropriate: allergies, current medications, past family history, past medical history, past social history, past surgical history, and problem list.  Review of Systems Pertinent items are noted in HPI.  Objective:  BP 111/77 (BP Location: Right Arm, Patient Position: Sitting, Cuff Size: Large)   Pulse 79   Ht 5\' 1"  (1.549 m) Comment: Reported  Wt 199 lb 3.2 oz (90.4 kg)   LMP 10/14/2021 (Exact Date)   BMI 37.64 kg/m    General:  alert and cooperative   Breasts:  not indicated  Lungs: clear to auscultation bilaterally  Heart:  regular rate and rhythm, S1, S2 normal, no murmur, click, rub or gallop  Abdomen: soft, non-tender; bowel sounds normal; no masses,  no organomegaly   Wound N/A  GU exam:  normal; small bleeding noted       GYNECOLOGY CLINIC  PROCEDURE NOTE  IUD Insertion Procedure Note Patient identified, informed consent performed.  Discussed risks of irregular bleeding, cramping, infection, malpositioning or misplacement of the IUD outside the uterus which may require further procedure such as laparoscopy. Time out was performed.  Urine pregnancy test negative.  Speculum placed in the vagina.  Cervix visualized.  Cleaned with Betadine x 2.  Grasped anteriorly with a single tooth tenaculum.  Uterus sounded to 6 cm.  Mirena IUD placed per manufacturer's recommendations.  Strings trimmed to 3 cm. Tenaculum was removed, good hemostasis noted.  Patient tolerated procedure well.   Patient was given post-procedure instructions.  She was advised to be have backup contraception for one week.  Patient was also asked to check IUD strings periodically and follow up in 4 weeks for IUD check.  Assessment:    1. Routine postpartum follow-up  2. History of gestational hypertension  Normal postpartum exam.   Plan:   Essential components of care per ACOG recommendations:  1.  Mood and well being: Patient with negative depression screening today. Reviewed local resources for support.  - Patient tobacco use? No.   - hx of drug use? No.    2. Infant care and feeding:  -Patient currently breastmilk feeding? Yes. Reviewed importance of draining breast regularly to support lactation.  -Social determinants of health (SDOH) reviewed in EPIC. No concerns  3. Sexuality, contraception and birth spacing - Patient does not want a pregnancy in the next year.  Desired family size is 2 children.  - Reviewed reproductive life planning.  Reviewed contraceptive methods based on pt preferences and effectiveness.  Patient desired IUD or IUS today.   - Discussed birth spacing of 18 months  4. Sleep and fatigue -Encouraged family/partner/community support of 4 hrs of uninterrupted sleep to help with mood and fatigue  5. Physical Recovery  - Discussed  patients delivery and complications. She describes her labor as mixed. - Patient had a Vaginal, no problems at delivery. Patient had  no  laceration. Perineal healing reviewed. Patient expressed understanding - Patient has urinary incontinence? No. - Patient is safe to resume physical and sexual activity  6.  Health Maintenance - HM due items addressed Yes - Last pap smear No results found for: "DIAGPAP" Pap smear done at today's visit.  -Breast Cancer screening indicated? No.   7. Chronic Disease/Pregnancy Condition follow up: Hypertension - PCP follow up, established with [facility] medical   Vonzella Nipple, PA-C [facility] [employer]

## 2022-08-21 NOTE — Addendum Note (Signed)
Addended by: Ina Homes B on: 08/21/2022 05:49 PM   Modules accepted: Orders

## 2022-08-22 ENCOUNTER — Other Ambulatory Visit (HOSPITAL_COMMUNITY)
Admission: RE | Admit: 2022-08-22 | Discharge: 2022-08-22 | Disposition: A | Discharge status: Home / Self Care | Payer: Medicaid Other | Source: Ambulatory Visit | Attending: Medical | Admitting: Medical

## 2022-08-22 ENCOUNTER — Encounter (HOSPITAL_BASED_OUTPATIENT_CLINIC_OR_DEPARTMENT_OTHER): Payer: Self-pay

## 2022-08-22 DIAGNOSIS — Z124 Encounter for screening for malignant neoplasm of cervix: Secondary | ICD-10-CM | POA: Diagnosis present

## 2022-08-22 NOTE — Addendum Note (Signed)
Addended by: Harrie Jeans on: 08/22/2022 11:21 AM   Modules accepted: Orders

## 2022-08-24 LAB — CYTOLOGY - PAP: Diagnosis: NEGATIVE

## 2022-12-11 ENCOUNTER — Encounter (HOSPITAL_BASED_OUTPATIENT_CLINIC_OR_DEPARTMENT_OTHER): Payer: Self-pay

## 2022-12-13 ENCOUNTER — Ambulatory Visit (INDEPENDENT_AMBULATORY_CARE_PROVIDER_SITE_OTHER): Payer: Medicaid Other | Admitting: Obstetrics & Gynecology

## 2022-12-13 ENCOUNTER — Encounter (HOSPITAL_BASED_OUTPATIENT_CLINIC_OR_DEPARTMENT_OTHER): Payer: Self-pay | Admitting: Obstetrics & Gynecology

## 2022-12-13 ENCOUNTER — Other Ambulatory Visit (HOSPITAL_BASED_OUTPATIENT_CLINIC_OR_DEPARTMENT_OTHER): Payer: Medicaid Other

## 2022-12-13 VITALS — BP 110/53 | HR 77 | Ht 61.0 in | Wt 199.6 lb

## 2022-12-13 DIAGNOSIS — Z30432 Encounter for removal of intrauterine contraceptive device: Secondary | ICD-10-CM

## 2022-12-13 DIAGNOSIS — Z30431 Encounter for routine checking of intrauterine contraceptive device: Secondary | ICD-10-CM

## 2022-12-13 DIAGNOSIS — N926 Irregular menstruation, unspecified: Secondary | ICD-10-CM | POA: Diagnosis not present

## 2022-12-13 NOTE — Progress Notes (Signed)
GYNECOLOGY  VISIT  CC:   IUD recheck  HPI: 27 y.o. G20P2002 Married Other or two or more races female here for IUD recheck.  Reports not really having much bleeding since the IUD was placed.  Stopped breast feeding about two weeks ago.  Started bleeding about four days ago and bleeding was very heavy with some clotting.  She's not had bleeding like this in the past.  She did have some cramping.  Bleeding and cramping are better today.  Reports minimal pain with IUD and after insertion.  No pain with intercourse.  Cannot feel string.  Advised OV when she sent my chart message about amount of bleeding she is having.  Pt reports she's had a lot more acne since the IUD was placed as well as increased bloating.     Past Medical History:  Diagnosis Date   Gallstone    History of gestational hypertension    Medical history non-contributory     MEDS:   No current outpatient medications on file prior to visit.   No current facility-administered medications on file prior to visit.    ALLERGIES: Patient has no known allergies.  SH:  married, non smoker  Review of Systems  Constitutional: Negative.   Genitourinary:        Bloating    PHYSICAL EXAMINATION:    BP (!) 110/53 (BP Location: Left Arm, Patient Position: Sitting, Cuff Size: Large)   Pulse 77   Ht 5\' 1"  (1.549 m) Comment: Reported  Wt 199 lb 9.6 oz (90.5 kg)   BMI 37.71 kg/m     General appearance: alert, cooperative and appears stated age Lymph:  no inguinal LAD noted  Pelvic: External genitalia:  no lesions              Urethra:  normal appearing urethra with no masses, tenderness or lesions              Bartholins and Skenes: normal                 Vagina: normal appearing vagina with normal color and discharge, no lesions              Cervix: no lesions and IUD string is very short              Bimanual Exam:  Uterus:  normal size, contour, position, consistency, mobility, non-tender              Adnexa: no mass,  fullness, tenderness  Ultrasound was performed to check placement of IUD due to shorter string.  This showed IUD was in correct location within the endometrium.  Right ovary has a simple appearing 2.5cm cyst. No free fluid in the cul de sac.  Endometrium is thin.  No fibroids noted.  After ultrasound, pt decided that she desired to have her IUD removed.  Verbal consent obtained.  Speculum was replaced.  IUD string grasped and IUD removed easily with one pull.  Pt tolerated procedure well.  Minimal bleeding noted.  Speculum removed.  Chaperone, Ina Homes, CMA, was present for exam.  Assessment/Plan: 1. Abnormal bleeding in menstrual cycle - pt is going to monitor over the next few months.  Does not want any more hormonal therapy at this time.  Will call if decides to start another form of contraception.  She is also going to call if has another heavy cycle like this one.  2. Encounter for IUD removal - pt is going to use condoms  for the time being for contraception.

## 2023-01-23 ENCOUNTER — Encounter (HOSPITAL_BASED_OUTPATIENT_CLINIC_OR_DEPARTMENT_OTHER): Payer: Self-pay | Admitting: Obstetrics & Gynecology

## 2023-05-15 ENCOUNTER — Encounter (HOSPITAL_BASED_OUTPATIENT_CLINIC_OR_DEPARTMENT_OTHER): Payer: Self-pay | Admitting: Obstetrics & Gynecology

## 2023-06-05 ENCOUNTER — Ambulatory Visit (HOSPITAL_BASED_OUTPATIENT_CLINIC_OR_DEPARTMENT_OTHER): Payer: Medicaid Other | Admitting: Advanced Practice Midwife

## 2023-06-05 ENCOUNTER — Encounter (HOSPITAL_BASED_OUTPATIENT_CLINIC_OR_DEPARTMENT_OTHER): Payer: Self-pay | Admitting: Advanced Practice Midwife

## 2023-06-05 VITALS — BP 108/63 | HR 75 | Ht 61.0 in | Wt 200.4 lb

## 2023-06-05 DIAGNOSIS — Z30011 Encounter for initial prescription of contraceptive pills: Secondary | ICD-10-CM

## 2023-06-05 DIAGNOSIS — Z3009 Encounter for other general counseling and advice on contraception: Secondary | ICD-10-CM | POA: Diagnosis not present

## 2023-06-05 MED ORDER — DROSPIRENONE-ETHINYL ESTRADIOL 3-0.02 MG PO TABS: 1.0000 | ORAL_TABLET | Freq: Every day | ORAL | 11 refills | Status: DC

## 2023-06-05 NOTE — Progress Notes (Signed)
   GYNECOLOGY PROGRESS NOTE  History:  27 y.o. [id number] presents to [facility] office today for contraceptive visit. She had side effects with the IUD, which was removed by Dr Hyacinth Meeker on 12/13/22. She was using condoms but had a surprise pregnancy in August with a TAB.  She desires to start a birth control pill today.  She denies h/a, dizziness, shortness of breath, n/v, or fever/chills.    The following portions of the patient's history were reviewed and updated as appropriate: allergies, current medications, past family history, past medical history, past social history, past surgical history and problem list. Last pap smear on 08/2022 was normal.  Health Maintenance Due  Topic Date Due   Medicare Annual Wellness (AWV)  Never done   HPV VACCINES (1 - 3-dose series) Never done   INFLUENZA VACCINE  04/05/2023   COVID-19 Vaccine (1 - 2023-24 season) Never done     Review of Systems:  Pertinent items are noted in HPI.   Objective:  Physical Exam Blood pressure 108/63, pulse 75, height 5\' 1"  (1.549 m), weight 200 lb 6.4 oz (90.9 kg), last menstrual period 06/03/2023, not currently breastfeeding. VS reviewed, nursing note reviewed,  Constitutional: well developed, well nourished, no distress HEENT: normocephalic CV: normal rate Pulm/chest wall: normal effort Breast Exam: deferred Abdomen: soft Neuro: alert and oriented x 3 Skin: warm, dry Psych: affect normal Pelvic exam: Cervix pink, visually closed, without lesion, scant white creamy discharge, vaginal walls and external genitalia normal Bimanual exam: Cervix 0/long/high, firm, anterior, neg CMT, uterus nontender, nonenlarged, adnexa without tenderness, enlargement, or mass  Assessment & Plan:  1. Encounter for counseling regarding contraception --Discussed pt contraceptive plans and reviewed contraceptive methods based on pt preferences and effectiveness.  Pt prefers  OCPs for regular menses and improved skin.   -  drospirenone-ethinyl estradiol (YAZ) 3-0.02 MG tablet; Take 1 tablet by mouth daily.  Dispense: 28 tablet; Refill: 11    Return in about 1 year (around 06/04/2024) for annual exam.   Sharen Counter, CNM 5:27 PM

## 2023-07-05 ENCOUNTER — Encounter: Payer: Self-pay | Admitting: Advanced Practice Midwife

## 2023-07-07 IMAGING — US US OB < 14 WEEKS - US OB TV
1 series · 15 of 28 positions shown · non-contrast
Comparison: None.

CLINICAL DATA: Initial evaluation for vaginal bleeding, early
pregnancy.

EXAM:
OBSTETRIC <14 WK ULTRASOUND
TECHNIQUE: Transabdominal ultrasound was performed for evaluation of the
gestation as well as the maternal uterus and adnexal regions.

[Series 1: us ob < 14 weeks - us ob tv · 15 of 45 slices shown]
[im 1/45]
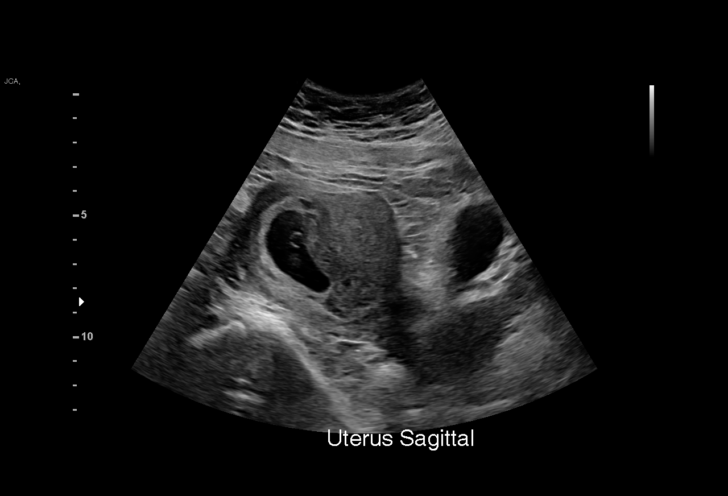
[im 4/45]
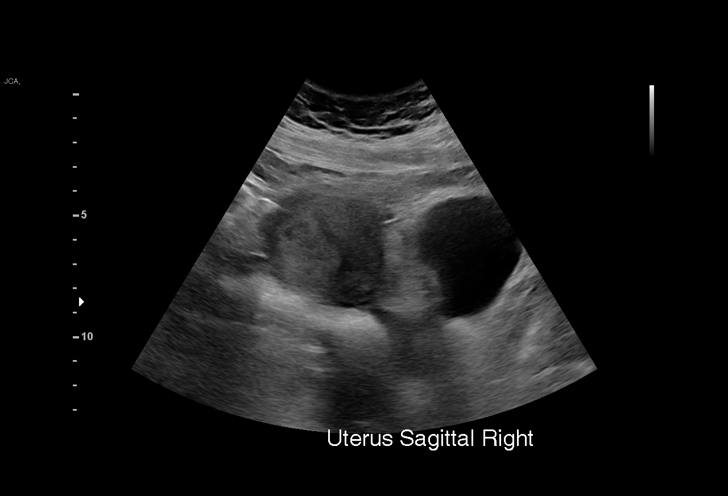
[im 7/45]
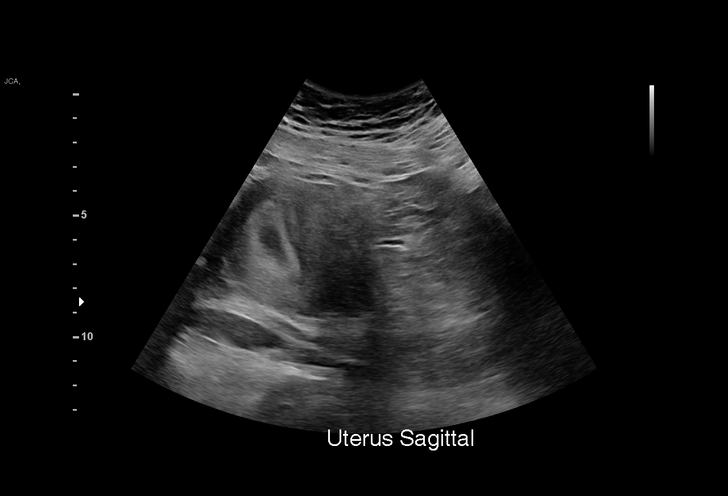
[im 10/45]
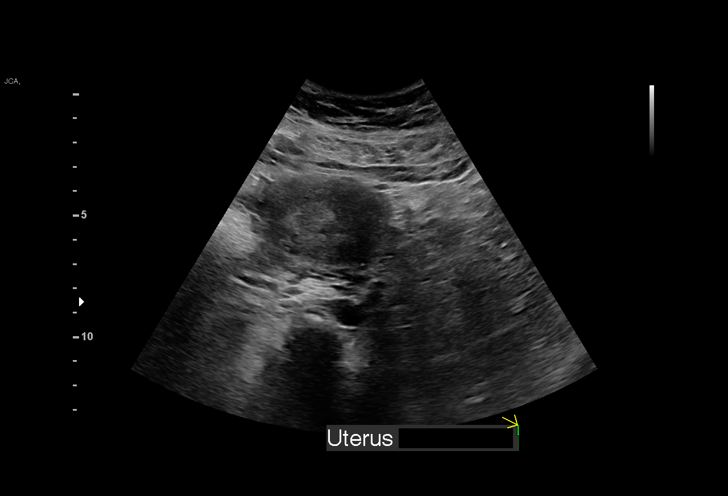
[im 14/45]
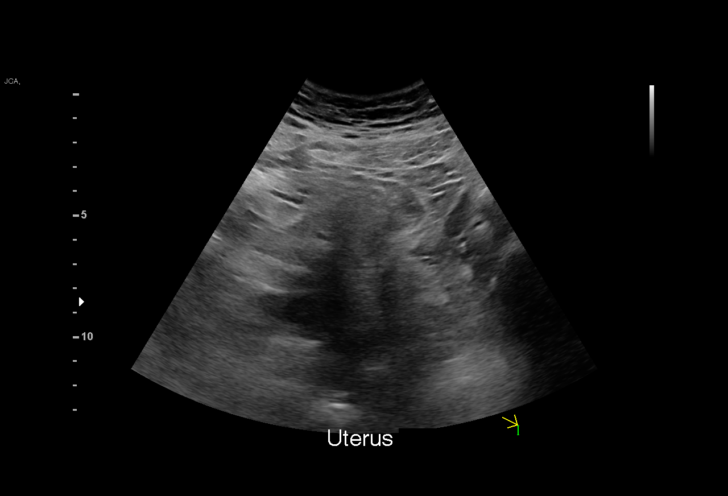
[im 17/45]
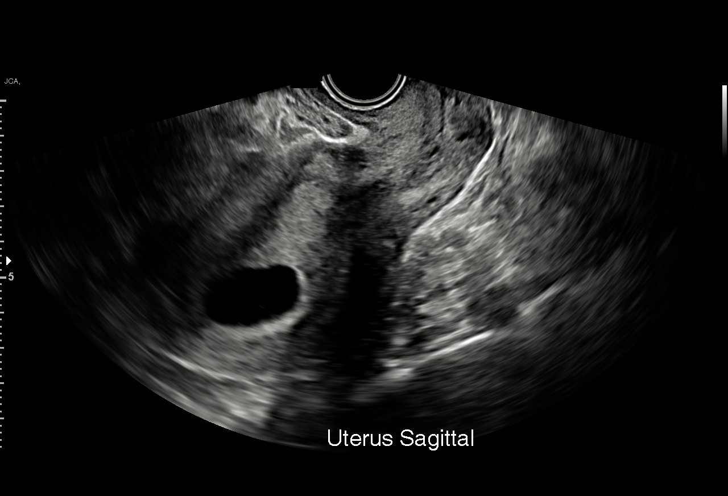
[im 20/45]
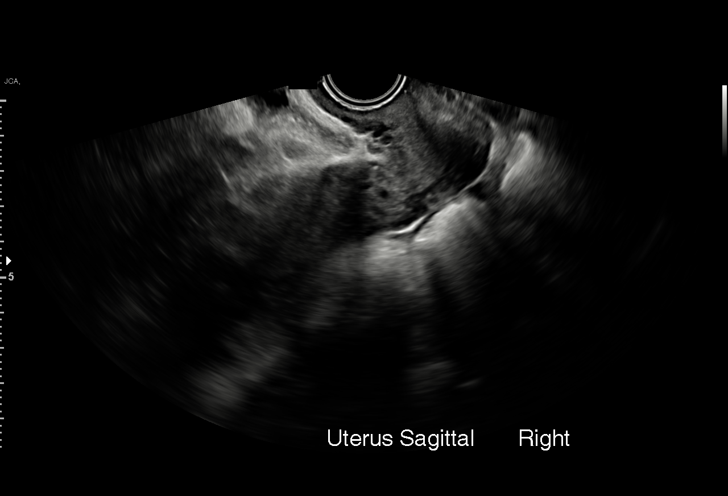
[im 23/45]
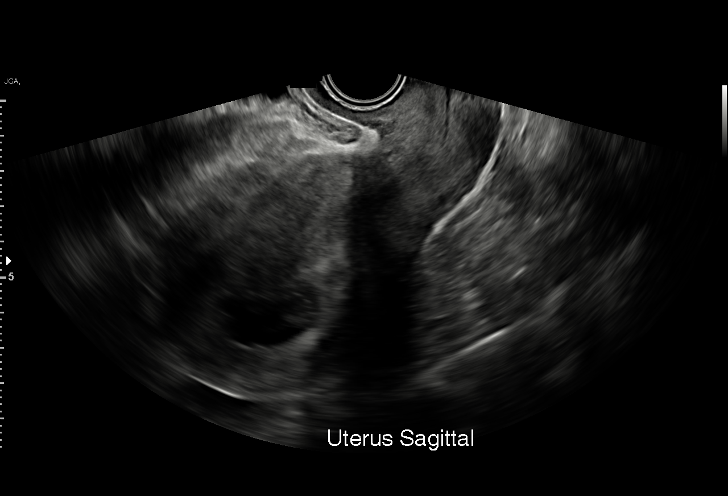
[im 25/45]
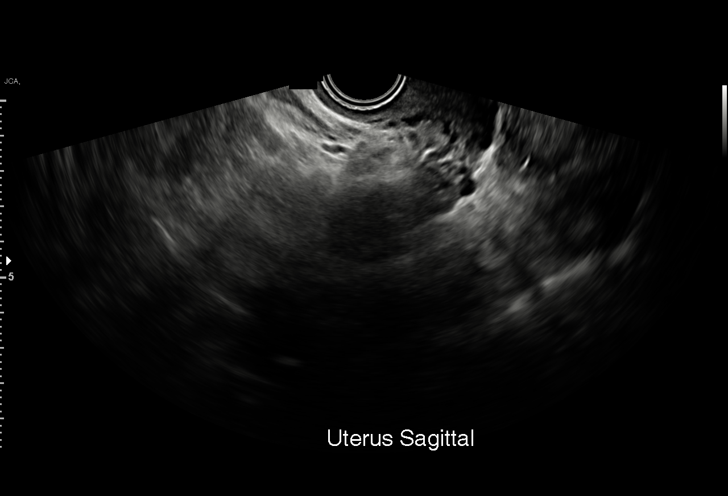
[im 28/45]
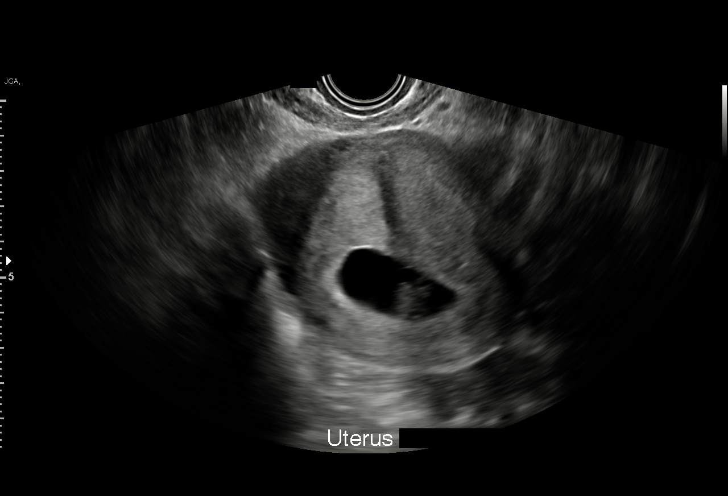
[im 31/45]
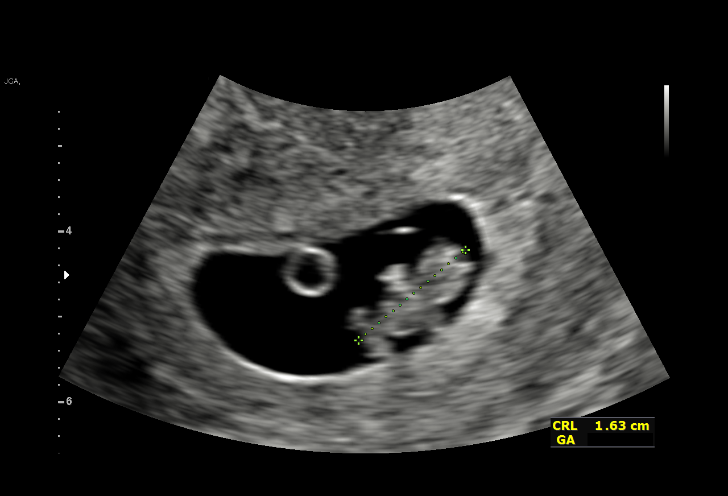
[im 35/45]
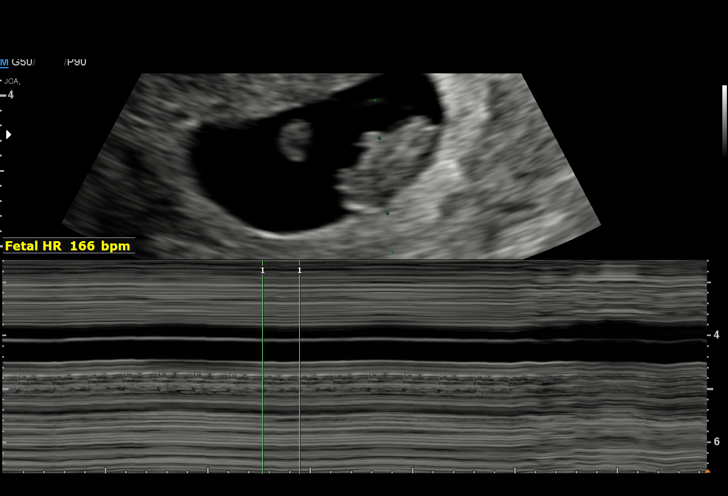
[im 38/45]
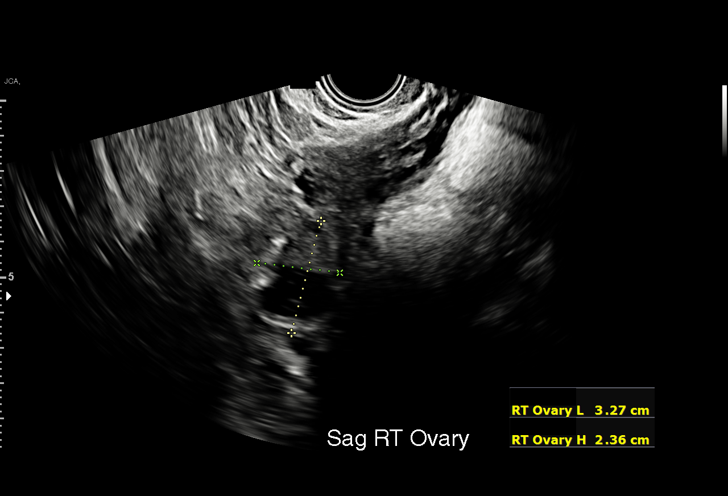
[im 41/45]
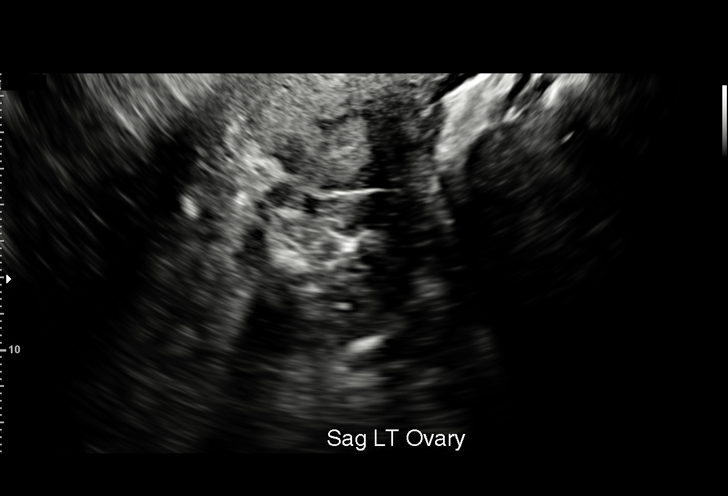
[im 45/45]
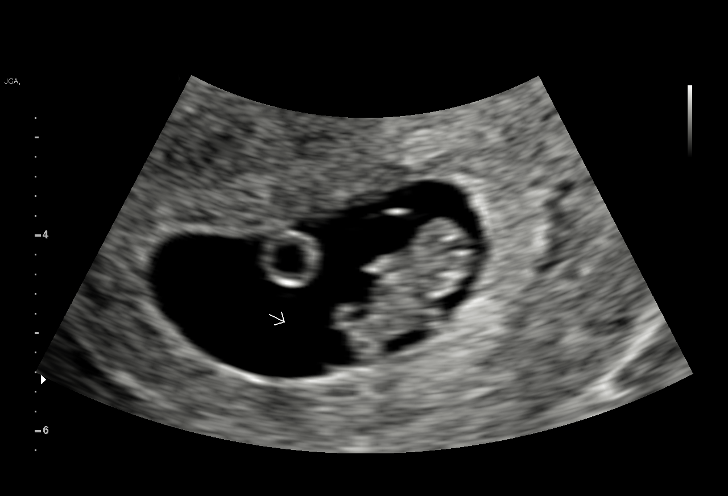

[15 of 28 positions shown; findings below may reference images not displayed]

FINDINGS: Intrauterine gestational sac: Signal

Yolk sac:  Present

Embryo:  Present

Cardiac Activity: Present

Heart Rate: 166 bpm

CRL:   16.3 mm   8 w 0 d                  US EDC: 07/28/2022

Subchorionic hemorrhage:  None visualized.

Maternal uterus/adnexae: Ovaries within normal limits bilaterally.
No adnexal mass or free fluid.
IMPRESSION: 1. Single viable IUP, estimated gestational age 8 weeks and 0 day by
crown-rump length, with ultrasound EDC of 07/28/2022. No
complication.
2. No other acute maternal uterine or adnexal abnormality.

## 2023-08-23 ENCOUNTER — Ambulatory Visit (HOSPITAL_BASED_OUTPATIENT_CLINIC_OR_DEPARTMENT_OTHER): Payer: Medicaid Other | Admitting: Certified Nurse Midwife

## 2023-08-23 ENCOUNTER — Encounter (HOSPITAL_BASED_OUTPATIENT_CLINIC_OR_DEPARTMENT_OTHER): Payer: Self-pay | Admitting: Certified Nurse Midwife

## 2023-08-23 VITALS — BP 116/69 | HR 76 | Ht 61.0 in | Wt 202.6 lb

## 2023-08-23 DIAGNOSIS — Z6838 Body mass index (BMI) 38.0-38.9, adult: Secondary | ICD-10-CM

## 2023-08-23 DIAGNOSIS — N926 Irregular menstruation, unspecified: Secondary | ICD-10-CM | POA: Diagnosis not present

## 2023-08-23 NOTE — Progress Notes (Signed)
Subjective:     Angela Lozano is a 27 y.o. female here for return problem visit due to irregular menses. Patient states that sometimes her periods are "every 2 weeks". States "I would like to figure out why my periods are doing this".   Pt had Mirena IUD removed April 2024 (had it for a few months). She felt like it made her acne worse. She had a limited Pelvic US at that time.  Pt was evaluated 06/2023 and wanted birth control. She was started on COC. She messaged 4 weeks later and reported that her arms were "going numb" intermittently. She was advised to stop the combined oral contraceptives.  Pt states she would prefer not to use hormonal contraception at this time to regulate periods.   The following portions of the patient's history were reviewed and updated as appropriate: allergies, current medications, past family history, past medical history, past social history, past surgical history, and problem list.   Review of Systems Pertinent items are noted in HPI.    Objective:    BP 116/69 (BP Location: Right Arm, Patient Position: Sitting, Cuff Size: Large)   Pulse 76   Ht 5\' 1"  (1.549 m)   Wt 202 lb 9.6 oz (91.9 kg)   LMP 08/21/2023   Breastfeeding No   BMI 38.28 kg/m  General appearance: alert and cooperative    Assessment:    Irregular menses .  BMI 38   Plan:    CBC, TSH, HgA1C Discussed options for management of irregular menstrual cycles but patient declines all at this time. Pap smear UTD 08/2022 Neg, next pap due 08/2025.  Letta Kocher

## 2023-08-24 LAB — CBC
Hematocrit: 39.3 % (ref 34.0–46.6)
Hemoglobin: 12.2 g/dL (ref 11.1–15.9)
MCH: 24.6 pg — ABNORMAL LOW (ref 26.6–33.0)
MCHC: 31 g/dL — ABNORMAL LOW (ref 31.5–35.7)
MCV: 79 fL (ref 79–97)
Platelets: 374 10*3/uL (ref 150–450)
RBC: 4.95 x10E6/uL (ref 3.77–5.28)
RDW: 15.4 % (ref 11.7–15.4)
WBC: 6.7 10*3/uL (ref 3.4–10.8)

## 2023-08-24 LAB — TSH RFX ON ABNORMAL TO FREE T4: TSH: 1.37 u[IU]/mL (ref 0.450–4.500)

## 2023-08-24 LAB — BETA HCG QUANT (REF LAB): hCG Quant: <1 m[IU]/mL

## 2023-08-24 LAB — HEMOGLOBIN A1C
Est. average glucose Bld gHb Est-mCnc: 120 mg/dL
Hgb A1c MFr Bld: 5.8 % — ABNORMAL HIGH (ref 4.8–5.6)

## 2023-10-05 ENCOUNTER — Other Ambulatory Visit (HOSPITAL_BASED_OUTPATIENT_CLINIC_OR_DEPARTMENT_OTHER): Payer: Self-pay | Admitting: Certified Nurse Midwife
